# Patient Record
Sex: Female | Born: 1980 | Race: Asian | Hispanic: No | Marital: Married | Smoking: Never smoker
Health system: Southern US, Community
[De-identification: ages and names within clinical notes are randomized; demographics above are authoritative.]

## PROBLEM LIST (undated history)

## (undated) DIAGNOSIS — K219 Gastro-esophageal reflux disease without esophagitis: Secondary | ICD-10-CM

## (undated) DIAGNOSIS — E781 Pure hyperglyceridemia: Secondary | ICD-10-CM

## (undated) HISTORY — DX: Gastro-esophageal reflux disease without esophagitis: K21.9

## (undated) HISTORY — DX: Pure hyperglyceridemia: E78.1

---

## 2006-03-11 ENCOUNTER — Emergency Department (HOSPITAL_COMMUNITY): Admission: EM | Admit: 2006-03-11 | Discharge: 2006-03-11 | Payer: Self-pay | Admitting: Emergency Medicine

## 2009-08-02 ENCOUNTER — Encounter: Payer: Self-pay | Admitting: Women's Health

## 2009-08-02 ENCOUNTER — Ambulatory Visit: Payer: Self-pay | Admitting: Women's Health

## 2009-08-02 ENCOUNTER — Other Ambulatory Visit: Admission: RE | Admit: 2009-08-02 | Discharge: 2009-08-02 | Payer: Self-pay | Admitting: Gynecology

## 2013-03-13 ENCOUNTER — Ambulatory Visit: Payer: Self-pay | Admitting: Obstetrics & Gynecology

## 2014-12-16 ENCOUNTER — Other Ambulatory Visit (HOSPITAL_COMMUNITY)
Admission: RE | Admit: 2014-12-16 | Discharge: 2014-12-16 | Disposition: A | Discharge status: Home / Self Care | Payer: 59 | Source: Ambulatory Visit | Attending: Women's Health | Admitting: Women's Health

## 2014-12-16 ENCOUNTER — Ambulatory Visit (INDEPENDENT_AMBULATORY_CARE_PROVIDER_SITE_OTHER): Payer: 59 | Admitting: Women's Health

## 2014-12-16 ENCOUNTER — Encounter: Payer: Self-pay | Admitting: Women's Health

## 2014-12-16 VITALS — BP 118/80 | Ht 63.0 in | Wt 141.0 lb

## 2014-12-16 DIAGNOSIS — Z1151 Encounter for screening for human papillomavirus (HPV): Secondary | ICD-10-CM | POA: Insufficient documentation

## 2014-12-16 DIAGNOSIS — Z01419 Encounter for gynecological examination (general) (routine) without abnormal findings: Secondary | ICD-10-CM | POA: Diagnosis present

## 2014-12-16 NOTE — Patient Instructions (Signed)
Dolor De Espalda Crnico (Chronic Back Pain)  Cuando el dolor en la espalda dura ms de 3 meses, se denomina dolor de espalda crnico. Las personas que sufren dolor de espalda crnico generalmente pasan por perodos en los que es ms intenso (brotes).  CAUSAS  El dolor de espalda crnico puede estar originado en el desgaste degeneracin) de las diferentes estructuras de la espalda. Estas estructuras incluyen:  Los huesos de la columna vertebral (vrtebras) y las articulaciones rodean la mdula espinal y las races nerviosas (facetas).  Hay un tejido fibroso y fuerte que conecta las vrtebras (ligamentos). La degeneracin de estas estructuras provoca presin [employer]. Esto puede causar Manufacturing systems engineer.  INSTRUCCIONES PARA EL CUIDADO EN EL HOGAR   Evite encorvarse, levantar mucho peso, permanecer sentado por [employer], y las actividades que puedan empeorar el problema.  Tome breves perodos de descanso a travs del da para reducir Conservation officer, historic buildings durante los brotes. Recostarse o Public affairs consultant de pie generalmente es mejor que permanecer sentado para Production assistant, radio.  Tome slo medicamentos de venta libre o recetados, segn las indicaciones del mdico. SOLICITE ATENCIN MDICA DE INMEDIATO SI:  Siente debilidad intensa o adormecimiento en una de sus piernas o pies.  Tiene dificultad para controlar la vejiga o el intestino.  Presenta nuseas, vmitos, dolor abdominal, falta de aire o desmayos. Document Released: 11/27/2005 Document Revised: 02/19/2012 [facility] Patient Information 2015 [place]. This information is not intended to replace advice given to you by your health care provider. Make sure you discuss any questions you have with your health care provider. [place] (Health Maintenance) Adoptar un estilo de vida saludable y recibir atencin preventiva pueden ser de suma utilidad para promover la salud y Musician. Hable con el mdico para saber cul es el  esquema de exmenes peridicos adecuado para usted. Esta es una buena oportunidad para Teacher, adult education peridicamente al mdico sobre cmo prevenir enfermedades y [place] sano. Entre cada control mdico, hay muchas cosas que puede hacer por s solo. Los expertos han investigado mucho acerca de los cambios en el estilo de vida y las medidas preventivas que muy probablemente preserven su salud. Consulte al mdico para obtener ms informacin. EL PESO Y LA DIETA  Consuma una dieta saludable.  Incluya abundante cantidad de verduras, frutas, productos lcteos descremados y protenas magras.  No coma muchos alimentos con alto contenido de grasas slidas, azcares agregados o sal.  Realice actividad fsica con regularidad. Esta es una de las cosas ms importantes que puede hacer por su salud.  La [employer] de las personas adultas deben hacer actividad fsica durante por lo menos 182minutos semanales. El ejercicio debe aumentar la frecuencia cardaca y Nature conservation officer sudar (ejercicio de intensidad moderada).  Adems, casi todos los adultos deben hacer ejercicios de fortalecimiento al [employer] veces por semana como complemento del ejercicio de [place]. Mantenga un peso saludable.  El ndice de masa corporal [facility]) es una medida que puede usarse para identificar posibles problemas relacionados con el peso. Este ofrece un clculo estimativo de la Air traffic controller en funcin del peso y Agricultural consultant. El mdico puede determinar su [facility] y ayudarlo a Science writer y Theatre manager un peso saludable.  Para las mujeres mayores de 20aos:  Un [facility] menor de 18,5 se considera bajo peso.  Un [facility] entre 18,5 y 24,9 es normal.  Un [facility] entre 25 y 29,9 es sobrepeso.  Un IMC de 30 o ms se considera obesidad. Controlar los niveles de colesterol y lpidos en la sangre  Debe comenzar a Education administrator de sangre para controlar los [place] de lpidos y Oncologist a Glass blower/designer de [place], y repetir estos estudios cada 5aos.  Tal  vez deba someterse a controles de los niveles de colesterol con ms frecuencia si:  Tiene los niveles de lpidos o colesterol elevados.  Es mayor de 50aos.  Tiene un riesgo alto de tener enfermedades cardacas. DETECCIN DE CNCER  Cncer de pulmn  Se recomienda realizar exmenes de deteccin de cncer de pulmn a las personas adultas que tienen entre 55 y 80aos, y corren riesgo de Warehouse manager cncer de pulmn debido a sus antecedentes de tabaquismo.  Se recomienda realizar una tomografa computarizada anual de baja dosis de los pulmones a las personas que:  Siguen fumando.  Hayan dejado de fumar en los ltimos 15aos.  Hayan fumado un paquete diario durante 30aos. El ndice ao-paquete equivale a fumar, en promedio, un paquete de cigarrillos diario durante 1ao.  Debe seguir realizndose estudios de deteccin anual hasta que hayan pasado 15aos desde que dej de fumar.  Estos estudios deben suspenderse si tiene un problema de salud que le impedira recibir tratamiento para Management consultant de pulmn. Cncer de mama  Ponga en prctica la "autoconciencia de las mamas". Esto significa reconocer la apariencia normal de las mamas y cmo las siente.  Adems implica realizarse autoexmenes peridicos de [place]. Informe al mdico si hay algn cambio, sin importar cun pequeo sea.  Si tiene entre 20 y 30aos, un mdico debe hacerle un examen clnico de las mamas cada 1 a 3aos como parte del examen habitual de salud.  Si es mayor de 40aos, debe Geographical information systems officer un examen clnico de las [employer]. Tambin debe considerar la posibilidad de realizarse una radiografa de las mamas ([place]) todos los [place].  Si tiene antecedentes familiares de cncer de mama, hable con el mdico para saber si debe someterse a un estudio gentico.  Si tiene un riesgo alto de Hotel manager de mama, hable con el mdico para saber si debe hacerse una resonancia magntica y [employer].  Se recomienda una evaluacin del gen del cncer de mama (BRCA) a las mujeres que tengan familiares con tumores malignos relacionados con el BRCA. Los tumores malignos relacionados con elBRCA incluyen:  [employer].  Los [employer].  Los tumores malignos del peritoneo.  Los resultados de la evaluacin determinarn la necesidad de asesoramiento gentico y de [place] de BRCA1 y BRCA2. Cncer de cuello uterino Ya no se recomiendan los exmenes plvicos de rutina para la deteccin del cncer de cuello uterino en las mujeres que no estn embarazadas que son consideradas sujetos de bajo riesgo de Warehouse manager cncer de los rganos de la pelvis (ovarios, tero y vagina) y que no tienen sntomas. Tal vez sea necesario realizar un examen plvico si tiene sntomas, incluidos aquellos que estn asociados con infecciones en la pelvis. Pregntele al mdico si un examen plvico de deteccin es adecuado para usted.   El Papanicolau es la prueba de deteccin del cncer de cuello uterino para las mujeres que podran Scientist, research (life sciences).  Si le han realizado una histerectoma por un problema que no era cncer u otra enfermedad que podra causar cncer, ya no necesitar realizarse pruebas de Papanicolaou.  Si es mayor de 65aos y los resultados de las pruebas de Papanicolaou han sido normales durante los ltimos 10aos, ya no es necesario que se realice estos estudios.  Si ha recibido Pharmacist, community para el  cncer de cuello uterino o para una enfermedad que podra causar cncer, necesitar realizarse una prueba de Papanicolaou y controles durante al menos 64 aos de concluido el [place].  Si ya no se realiza pruebas de Papanicolaou, debe evaluar sus factores de riesgo si estos se modifican (por ejemplo, tiene un nuevo compaero sexual). Esta situacin puede influir en la necesidad de que se someta nuevamente a estudios de deteccin.  Algunas mujeres sufren problemas mdicos que aumentan la  probabilidad de tener cncer de cuello uterino. En este caso, el mdico podr indicarle que se someta a exmenes de deteccin y pruebas de Papanicolaou con ms frecuencia.  La prueba del virus del Engineer, technical sales (VPH) es un estudio adicional que puede usarse para la deteccin del cncer de cuello uterino. Esta prueba busca la presencia del virus que puede causar cambios celulares en el cuello del tero. Las clulas que se recolectan durante la prueba de Papanicolaou pueden usarse para el VPH.  La prueba del VPH puede usarse para examinar a las [employer] de 09FGH. Someterse a International aid/development worker del VPH puede prolongar el [employer] las pruebas de Papanicolaou normales de tres a Product manager.  Adems, se debe realizar la prueba del VPH para evaluar a las mujeres de cualquier edad cuyos resultados del Papanicolau no sean claros.  Despus de los 30aos, las mujeres deben realizarse pruebas del VPH con la misma frecuencia que las pruebas de Papanicolau. Cncer colorrectal  Es posible detectar este tipo de cncer y, a menudo, es posible prevenirlo.  Generalmente, los estudios de deteccin de rutina del cncer colorrectal empiezan a hacerse a Proofreader de los 39aos y [employer] [phone].  El mdico puede recomendar que se los haga antes, si tiene factores de riesgo de cncer de colon.  Adems, el mdico puede recomendar que use un kit de prueba casera para hallar sangre oculta en la materia fecal.  Es posible que se use una pequea cmara en el extremo de un tubo para examinar directamente el colon (sigmoidoscopa o colonoscopa), con el fin de Hydrographic surveyor las formas ms incipientes de Surveyor, minerals.  Generalmente, los estudios de deteccin de rutina se Insurance underwriter a Proofreader de [place].  El examen directo del colon debe repetirse cada 5 a 10aos hasta cumplir 75aos. Sin embargo, tal vez deba someterse a la prueba de deteccin con ms frecuencia si se encuentran formas  incipientes de plipos precancerosos o pequeos tumores. Cncer de piel  Revsese la piel peridicamente desde los dedos de los pies hasta la cabeza.  Informe al mdico si aparecen nuevos lunares o si nota cambios en los que ya tiene, especialmente en la forma o el color.  Tambin notifquele si tiene un lunar cuyo tamao es ms grande que el de la goma de un lpiz.  Use siempre pantalla solar. Aplique pantalla solar tantas veces como pueda a lo largo del da.  Protjase usando mangas y pantalones largos, un sombrero de ala ancha y gafas para el sol todo [place], siempre que est al [place]. ENFERMEDADES CARDACAS, DIABETES E HIPERTENSIN ARTERIAL   Debe controlarse la presin arterial al menos cada 1 o 2aos. La hipertensin arterial causa enfermedades cardacas y [place] el riesgo de ictus.  Si tiene entre 31 y 79aos, consulte al mdico si debe tomar aspirina para prevenir ictus.  Hgase anlisis peridicos para la diabetes, que incluyen la toma de [place] de sangre para Freight forwarder nivel de azcar en la sangre mientras est en ayunas.  Si su peso es normal y tiene un riesgo bajo de tener diabetes, hgase este anlisis una vez cada tres aos, despus de los 45aos.  Si tiene sobrepeso y un riesgo alto de sufrir diabetes, considere la posibilidad de [employer] a una edad ms temprana o con ms frecuencia. PREVENCIN DE INFECCIONES  Hepatitis B  Si tiene un riesgo ms alto de tener hepatitisB, debe hacerse anlisis de deteccin de [place] virus. Se considera que tiene un alto riesgo de hepatitis B si:  Naci en un pas donde la hepatitisB es frecuente. Pregntele al mdico qu pases son considerados de Public affairs consultant.  Sus padres nacieron en un pas de alto riesgo, y usted no recibi la vacuna contra la hepatitis B.  [place].  [place] agujas para inyectarse drogas.  Convive con una persona que tiene hepatitisB.  Tuvo relaciones sexuales con una persona  que tiene hepatitisB.  Recibe tratamiento de hemodilisis.  Toma ciertos medicamentos para cuadros clnicos tales como cncer, trasplante de

## 2014-12-16 NOTE — Progress Notes (Signed)
Peggy Barker 08-31-1981 [id number]    History:    Presents for annual exam.  1 month ago had burning with urination, lasted for two days and resolved with berry juice. Regular monthly cycles/withdrawal. Peggy ReaUnsure if desires children. Declines other forms of contraception due to side effect profile. Same partner. Chronic low back pain and headaches persisting for three years, alleviated by ibuprofen, has seen primary care for both.   Past medical history, past surgical history, family history and social history were all reviewed and documented in the EPIC chart. Married for 11 years. Has an online organic business. Parents and siblings healthy. Originally [place]  ROS:  A ROS was performed and pertinent positives and negatives are included.  Exam:  Filed Vitals:   12/16/14 1419  BP: 118/80    General appearance:  Normal Thyroid:  Symmetrical, normal in size, without palpable masses or nodularity. Respiratory  Auscultation:  Clear without wheezing or rhonchi Cardiovascular  Auscultation:  Regular rate, without rubs, murmurs or gallops  Edema/varicosities:  Not grossly evident Abdominal  Soft,nontender, without masses, guarding or rebound.  Liver/spleen:  No organomegaly noted  Hernia:  None appreciated  Skin  Inspection:  Grossly normal   Breasts: Examined lying and sitting.     Right: Without masses, retractions, discharge or axillary adenopathy.     Left: Without masses, retractions, discharge or axillary adenopathy. Gentitourinary   Inguinal/mons:  Normal without inguinal adenopathy  External genitalia:  Normal  BUS/Urethra/Skene's glands:  Normal  Vagina:  Normal  Cervix:  Normal  Uterus:  Normal in size, shape and contour.  Midline and mobile  Adnexa/parametria:     Rt: Without masses or tenderness.   Lt: Without masses or tenderness.  Anus and perineum: Normal  Digital rectal exam: Normal sphincter tone without palpated masses or  tenderness  Assessment/Plan:  34 y.o.  MHF G0 for annual exam.     Contraception management  Normal GYN exam.   Plan: MVI encouraged and educated about benefits. Reviewed new pap guidelines. Healthy diet, exercise, SBE's, contraception options reviewed, declines will continue with withdrawal.  UA, Pap with HR HPV typing. New screening guidelines reviewed.    Harrington ChallengerYOUNG,Peggy Barker J WHNP, 3:01 PM 12/16/2014

## 2014-12-17 LAB — URINALYSIS W MICROSCOPIC + REFLEX CULTURE
Bacteria, UA: NONE SEEN
Bilirubin Urine: NEGATIVE
Casts: NONE SEEN
Crystals: NONE SEEN
GLUCOSE, UA: NEGATIVE mg/dL
Hgb urine dipstick: NEGATIVE
Ketones, ur: NEGATIVE mg/dL
LEUKOCYTES UA: NEGATIVE
Nitrite: NEGATIVE
PH: 7 (ref 5.0–8.0)
PROTEIN: NEGATIVE mg/dL
Specific Gravity, Urine: 1.008 (ref 1.005–1.030)
UROBILINOGEN UA: 0.2 mg/dL (ref 0.0–1.0)

## 2014-12-18 LAB — CYTOLOGY - PAP

## 2016-01-31 ENCOUNTER — Ambulatory Visit: Payer: 59 | Admitting: Gastroenterology

## 2017-04-25 ENCOUNTER — Encounter: Payer: Self-pay | Admitting: Gynecology

## 2018-04-24 ENCOUNTER — Ambulatory Visit (INDEPENDENT_AMBULATORY_CARE_PROVIDER_SITE_OTHER): Payer: 59 | Admitting: Physician Assistant

## 2018-04-24 ENCOUNTER — Other Ambulatory Visit: Payer: Self-pay

## 2018-04-24 ENCOUNTER — Encounter: Payer: Self-pay | Admitting: Physician Assistant

## 2018-04-24 VITALS — BP 98/60 | HR 93 | Temp 98.9°F | Resp 16 | Ht 63.75 in | Wt 141.2 lb

## 2018-04-24 DIAGNOSIS — R0982 Postnasal drip: Secondary | ICD-10-CM

## 2018-04-24 DIAGNOSIS — J029 Acute pharyngitis, unspecified: Secondary | ICD-10-CM | POA: Diagnosis not present

## 2018-04-24 LAB — POCT RAPID STREP A (OFFICE): Rapid Strep A Screen: NEGATIVE

## 2018-04-24 MED ORDER — FLUTICASONE PROPIONATE 50 MCG/ACT NA SUSP: 2.0000 | Freq: Every day | NASAL | 6 refills | Status: DC

## 2018-04-24 NOTE — Patient Instructions (Addendum)
Start using flonase twice daily for the next 1-2 months. Continue taking zyrtec twice daily. If Zyrtec-D is no longer helping you, try a different allergy medication (Claritin-D, Xyzal).   Stay well hydrated. Drink enough water and fluids to keep your urine clear or pale yellow.  Advil or ibuprofen for pain.   For sore throat try using a honey-based tea. Use 3 teaspoons of honey with juice squeezed from half lemon. Place shaved pieces of ginger into 1/2-1 cup of water and warm over stove top. Then mix the ingredients and repeat every 4 hours as needed.  For sore throat: ? Gargle with 8 oz of salt water ( tsp of salt per 1 qt of water) as often as every 1-2 hours to soothe your throat.  Gargle liquid benadryl.  Cepacol throat lozenges.  ?Use a humidifier in your bedroom at night while you sleep.  ?If you have allergies, avoid the things you are allergic to (like pollen, dust, animals, or mold)  Come back and see me if you are not better in 2 weeks.   We will contact you with the results of today's lab work.    Thank you for coming in today. I hope you feel we met your needs.  Feel free to call PCP if you have any questions or further requests.  Please consider signing up for MyChart if you do not already have it, as this is a great way to communicate with me.  Best,  [employer], PA-C

## 2018-04-24 NOTE — Progress Notes (Signed)
   Peggy Barker  MRN: [id number] DOB: 1981/09/14  PCP: Patient, No Pcp Per  Subjective:  Pt is a 37 year old female who presents to clinic for sore throat x 2 month. Endorses feeling "swollen" on the right side, itching and burning. R>L. Hurts worse at night.  She has been taking allergy pill - not working. She has tried cold medicine - not working.  Her throat has felt "weird" since she had endoscopy last year.   She does not smoke   Review of Systems  Constitutional: Negative for chills, diaphoresis, fatigue and fever.  HENT: Positive for sore throat. Negative for congestion, postnasal drip, rhinorrhea, sinus pressure, sinus pain, trouble swallowing and voice change.   Respiratory: Negative for cough, shortness of breath and wheezing.   Gastrointestinal: Negative for abdominal pain and nausea.  Skin: Negative.     There are no active problems to display for this patient.   Current Outpatient Medications on File Prior to Visit  Medication Sig Dispense Refill  . cetirizine-pseudoephedrine (ZYRTEC-D) 5-120 MG tablet Take 1 tablet by mouth 2 (two) times daily.    Marland Kitchen ibuprofen (ADVIL,MOTRIN) 200 MG tablet Take 200 mg by mouth every 6 (six) hours as needed.     No current facility-administered medications on file prior to visit.     No Known Allergies   Objective:  BP 98/60 (BP Location: Left Arm, Patient Position: Sitting, Cuff Size: Normal)   Pulse 93   Temp 98.9 F (37.2 C) (Oral)   Resp 16   Ht 5' 3.75" (1.619 m)   Wt 141 lb 3.2 oz (64 kg)   LMP 04/14/2018   SpO2 100%   BMI 24.43 kg/m   Physical Exam  Constitutional: She is oriented to person, place, and time. No distress.  HENT:  Right Ear: Tympanic membrane normal.  Left Ear: Tympanic membrane normal.  Nose: Mucosal edema present. No rhinorrhea. Right sinus exhibits no maxillary sinus tenderness and no frontal sinus tenderness. Left sinus exhibits no maxillary sinus tenderness and no frontal sinus  tenderness.  Mouth/Throat: Uvula is midline, oropharynx is clear and moist and mucous membranes are normal.  Cobblestoning of oropharynx.   Neurological: She is alert and oriented to person, place, and time.  Skin: Skin is warm and dry.  Psychiatric: Judgment normal.  Vitals reviewed.   Results for orders placed or performed in visit on 04/24/18  POCT rapid strep A  Result Value Ref Range   Rapid Strep A Screen Negative Negative    Assessment and Plan :  1. Post-nasal drip - fluticasone (FLONASE) 50 MCG/ACT nasal spray; Place 2 sprays into both nostrils daily.  Dispense: 16 g; Refill: 6 - Pt presents c/o sore throat x 2 months. PE suggests PND. Plan to treat for allergic rhinitis with flonase bid. Labs are pending, will contact with results.  RTC in 2 weeks if no improvement.  2. Sore throat - POCT rapid strep A - Culture, Group A Strep - CMP14+EGFR - Epstein-Barr virus VCA antibody panel  Mercer Pod, PA-C  [facility] [place] 04/24/2018 2:07 PM

## 2018-04-25 LAB — CMP14+EGFR
ALT: 21 IU/L (ref 0–32)
AST: 23 IU/L (ref 0–40)
Albumin/Globulin Ratio: 1.7 (ref 1.2–2.2)
Albumin: 4.3 g/dL (ref 3.5–5.5)
Alkaline Phosphatase: 60 IU/L (ref 39–117)
BUN/Creatinine Ratio: 10 (ref 9–23)
BUN: 7 mg/dL (ref 6–20)
Bilirubin Total: 0.4 mg/dL (ref 0.0–1.2)
CO2: 21 mmol/L (ref 20–29)
Calcium: 9.7 mg/dL (ref 8.7–10.2)
Chloride: 104 mmol/L (ref 96–106)
Creatinine, Ser: 0.71 mg/dL (ref 0.57–1.00)
GFR calc Af Amer: 129 mL/min/{1.73_m2} (ref >59)
GFR calc non Af Amer: 111 mL/min/{1.73_m2} (ref >59)
Globulin, Total: 2.5 g/dL (ref 1.5–4.5)
Glucose: 88 mg/dL (ref 65–99)
Potassium: 4.1 mmol/L (ref 3.5–5.2)
Sodium: 140 mmol/L (ref 134–144)
Total Protein: 6.8 g/dL (ref 6.0–8.5)

## 2018-04-25 LAB — EPSTEIN-BARR VIRUS VCA ANTIBODY PANEL
EBV Early Antigen Ab, IgG: <9 U/mL (ref 0.0–8.9)
EBV NA IgG: 228 U/mL — ABNORMAL HIGH (ref 0.0–17.9)
EBV VCA IgG: 37.4 U/mL — ABNORMAL HIGH (ref 0.0–17.9)
EBV VCA IgM: <36 U/mL (ref 0.0–35.9)

## 2018-04-27 LAB — CULTURE, GROUP A STREP: Strep A Culture: NEGATIVE

## 2018-05-07 ENCOUNTER — Encounter: Payer: Self-pay | Admitting: Physician Assistant

## 2018-09-17 ENCOUNTER — Encounter: Payer: Self-pay | Admitting: Family Medicine

## 2018-09-17 ENCOUNTER — Other Ambulatory Visit: Payer: Self-pay

## 2018-09-17 ENCOUNTER — Ambulatory Visit: Payer: 59 | Admitting: Family Medicine

## 2018-09-17 VITALS — BP 94/62 | HR 76 | Temp 98.4°F | Resp 17 | Ht 63.75 in | Wt 153.6 lb

## 2018-09-17 DIAGNOSIS — R05 Cough: Secondary | ICD-10-CM

## 2018-09-17 DIAGNOSIS — M25511 Pain in right shoulder: Secondary | ICD-10-CM

## 2018-09-17 DIAGNOSIS — K2270 Barrett's esophagus without dysplasia: Secondary | ICD-10-CM | POA: Diagnosis not present

## 2018-09-17 DIAGNOSIS — R0982 Postnasal drip: Secondary | ICD-10-CM

## 2018-09-17 DIAGNOSIS — R059 Cough, unspecified: Secondary | ICD-10-CM

## 2018-09-17 DIAGNOSIS — J301 Allergic rhinitis due to pollen: Secondary | ICD-10-CM | POA: Diagnosis not present

## 2018-09-17 DIAGNOSIS — G8929 Other chronic pain: Secondary | ICD-10-CM

## 2018-09-17 MED ORDER — FLUTICASONE PROPIONATE 50 MCG/ACT NA SUSP: 2.0000 | Freq: Every day | NASAL | 6 refills | Status: DC

## 2018-09-17 MED ORDER — OMEPRAZOLE 20 MG PO CPDR: 20.0000 mg | DELAYED_RELEASE_CAPSULE | Freq: Every day | ORAL | 3 refills | Status: DC

## 2018-09-17 NOTE — Patient Instructions (Addendum)
Aspercreme with lidocaine  Apply to the right shoulder as needed for pain    If you have lab work done today you will be contacted with your lab results within the next 2 weeks.  If you have not heard from [place] then please contact us. The fastest way to get your results is to register for My Chart.   IF you received an x-ray today, you will receive an invoice from [facility]. Please contact [facility] at [phone] with questions or concerns regarding your invoice.   IF you received labwork today, you will receive an invoice from [place]. Please contact [facility] at [phone] with questions or concerns regarding your invoice.   Our billing staff will not be able to assist you with questions regarding bills from these companies.  You will be contacted with the lab results as soon as they are available. The fastest way to get your results is to activate your My Chart account. Instructions are located on the last page of this paperwork. If you have not heard from [place] regarding the results in 2 weeks, please contact this office.     Barrett Esophagus Barrett esophagus occurs when the tissue that lines the esophagus changes or becomes damaged. The esophagus is the tube that carries food from the throat to the stomach. With Barrett esophagus, the cells that line the esophagus are replaced by cells that are similar to the lining of the intestines (intestinal metaplasia). Barrett esophagus itself may not cause any symptoms. However, many people who have Barrett esophagus also have gastroesophageal reflux disease (GERD), which may cause symptoms such as heartburn. Treatment may include medicines, procedures to destroy the abnormal cells, or surgery. Over time, a few people with this condition may develop cancer of the esophagus. What are the causes? The exact cause of this condition is not known. In some cases, the condition develops from damage to the lining of the esophagus  caused by GERD. GERD occurs when stomach acids flow up from the stomach into the esophagus. Frequent symptoms of GERD may cause intestinal metaplasia or cause cell changes (dysplasia). What increases the risk? The following factors may make you more likely to develop this condition:  Having GERD.  Being any of the following: ? Female. ? White (Caucasian). ? Obese. ? Older than 50.  Having a hiatal hernia.  Smoking.  What are the signs or symptoms? People with Barrett esophagus often have no symptoms. However, many people with this condition also have GERD. Symptoms of GERD may include:  Heartburn.  Difficulty swallowing.  Dry cough.  How is this diagnosed? Barrett esophagus may be diagnosed with an exam called an upper gastrointestinal endoscopy. During this exam, a thin, flexible tube (endoscope) is passed down your esophagus. The endoscope has a light and camera on the end of it. Your health care provider uses the endoscope to view the inside of your esophagus. During the exam, several tissue samples will be removed (biopsy) from your esophagus so they can be checked for intestinal metaplasia or dysplasia. How is this treated? Treatment for this condition may include:  Medicines (proton pump inhibitors, or PPIs) to decrease or stop GERD.  Periodic endoscopic exams to make sure that cancer is not developing.  A procedure or surgery for dysplasia. This may include: ? Endoscopic removal or destruction of abnormal cells. ? Removal of part of the esophagus (esophagectomy).  Follow these instructions at home: Eating and drinking  Eat more fruits and vegetables.  Avoid fatty foods.  Eat small, frequent meals instead of large meals.  Avoid foods that cause heartburn. These foods include: ? Coffee and alcoholic drinks. ? Tomatoes and foods made with tomatoes. ? Greasy or spicy foods. ? Chocolate and peppermint. General instructions   Take over-the-counter and  prescription medicines only as told by your health care provider.  Do not use any tobacco products, such as cigarettes, chewing tobacco, and e-cigarettes. If you need help quitting, ask your health care provider.  If your health care provider is treating you for GERD, make sure you follow all instructions and take medicines as directed.  Keep all follow-up visits as told by your health care provider. This is important. Contact a health care provider if:  You have heartburn or GERD symptoms.  You have difficulty swallowing. Get help right away if:  You have chest pain.  You are unable to swallow.  You vomit blood or material that looks like coffee grounds.  Your stool (feces) is bright red or dark. This information is not intended to replace advice given to you by your health care provider. Make sure you discuss any questions you have with your health care provider. Document Released: 02/17/2004 Document Revised: 05/04/2016 Document Reviewed: 09/09/2015 Elsevier Interactive Patient Education  [employer].

## 2018-09-17 NOTE — Progress Notes (Signed)
Chief Complaint  Patient presents with  . cold sxs/cough    seen 2 mos ago for cold sxs but they never went away, takes nyquil sometimes  . right shoulder blade pain    x 1 yr, taking ibuprofen occasionally for the pain, pain 7/10, cold compress and massages help sometimes but pain returns    HPI  Cough  Barrett's Esophagus Patient reports that she has been diagnosed with Barrett's Esophagus at [facility]  She does not take any PPI or reflux medications She was diagnosed a year ago She states that she has been coughing for 2-3 months It feels like she has something in her esophagus  Allergic rhinitis She states that she does zyrtec twice weekly She does not use the nasal spray because she lost it She does not have any green or purulent drainage  Right Shoulder Pain She reports that she gets right pain under the shoulder  She used ice packs and compresses and it comes and goes  She takes ibuprofen  Sometimes she gets it and it stays for a month She states that she took some ibuprofen  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 GERD (gastroesophageal reflux disease)     Current Outpatient Medications  Medication Sig Dispense Refill  . ibuprofen (ADVIL,MOTRIN) 200 MG tablet Take 200 mg by mouth every 6 (six) hours as needed.    . cetirizine-pseudoephedrine (ZYRTEC-D) 5-120 MG tablet Take 1 tablet by mouth 2 (two) times daily.    . fluticasone (FLONASE) 50 MCG/ACT nasal spray Place 2 sprays into both nostrils daily. 16 g 6  . omeprazole (PRILOSEC) 20 MG capsule Take 1 capsule (20 mg total) by mouth daily. 30 capsule 3   No current facility-administered medications for this visit.     Allergies: No Known Allergies  History reviewed. No pertinent surgical history.  Social History   Socioeconomic History  . Marital status: Married    Spouse name:  Not on file  . Number of children: Not on file  . Years of education: Not on file  . Highest education level: Not on file  Occupational History  . Not on file  Social Needs  . Financial resource strain: Not on file  . Food insecurity:    Worry: Not on file    Inability: Not on file  . Transportation needs:    Medical: Not on file    Non-medical: Not on file  Tobacco Use  . Smoking status: Never Smoker  . Smokeless tobacco: Never Used  Substance and Sexual Activity  . Alcohol use: No    Alcohol/week: 0.0 standard drinks  . Drug use: No  . Sexual activity: Yes    Comment: INTERCOURSE 20'S SEXUAL PARTNER 1  Lifestyle  . Physical activity:    Days per week: Not on file    Minutes per session: Not on file  . Stress: Not on file  Relationships  . Social connections:    Talks on phone: Not on file    Gets together: Not on file    Attends religious service: Not on file    Active member of club or organization: Not on file    Attends meetings of clubs or organizations: Not on file    Relationship status: Not on file  Other Topics Concern  . Not on file  Social History Narrative  .  Not on file    History reviewed. No pertinent family history.   ROS Review of Systems See HPI Constitution: No fevers or chills No malaise No diaphoresis Skin: No rash or itching Eyes: no blurry vision, no double vision GU: no dysuria or hematuria Neuro: no dizziness or headaches  all others reviewed and negative   Objective: Vitals:   09/17/18 0933  BP: 94/62  Pulse: 76  Resp: 17  Temp: 98.4 F (36.9 C)  TempSrc: Oral  SpO2: 100%  Weight: 153 lb 9.6 oz (69.7 kg)  Height: 5' 3.75" (1.619 m)    Physical Exam  Constitutional: She is oriented to person, place, and time. She appears well-developed and well-nourished.  HENT:  Head: Normocephalic and atraumatic.  Right Ear: External ear normal.  Left Ear: External ear normal.  Nose: Nose normal.  Mouth/Throat: Oropharynx is  clear and moist.  Eyes: Conjunctivae and EOM are normal.  Neck: Normal range of motion. Neck supple.  Cardiovascular: Normal rate, regular rhythm and normal heart sounds.  No murmur heard. Pulmonary/Chest: Effort normal and breath sounds normal. No stridor. No respiratory distress.  Abdominal: Soft. Bowel sounds are normal. She exhibits no distension and no mass. There is no tenderness. There is no guarding.  Musculoskeletal:       Right shoulder: She exhibits tenderness. She exhibits normal range of motion, no bony tenderness, no swelling, no effusion, no crepitus, no deformity, no laceration, no pain, no spasm, normal pulse and normal strength.       Left shoulder: Normal. She exhibits normal range of motion, no tenderness and no bony tenderness.       Arms: Neurological: She is alert and oriented to person, place, and time.  Skin: Skin is warm. Capillary refill takes less than 2 seconds.  Psychiatric: She has a normal mood and affect. Her behavior is normal. Judgment and thought content normal.    Assessment and Plan Arial was seen today for cold sxs/cough and right shoulder blade pain.  Diagnoses and all orders for this visit:  Barrett's esophagus without dysplasia- pt with ? Of barrett's  Will get records from [place] to verigy Will give omeprazole as GERD can cause cough -     omeprazole (PRILOSEC) 20 MG capsule; Take 1 capsule (20 mg total) by mouth daily.  Chronic right shoulder pain- continue prn NSAID  Advised aspercreme with lidocaine topical Referral placed for PT -     Ambulatory referral to Physical Therapy  Seasonal allergic rhinitis due to pollen- resume zyrtec and flonase   Cough- could be due to rhinitis and GERD from ?Barrett's  Post-nasal drip -     fluticasone (FLONASE) 50 MCG/ACT nasal spray; Place 2 sprays into both nostrils daily.     Siyana Erney A Zyad Boomer

## 2018-09-18 ENCOUNTER — Ambulatory Visit: Payer: 59 | Admitting: Physician Assistant

## 2018-10-24 ENCOUNTER — Ambulatory Visit: Payer: 59 | Attending: Family Medicine

## 2018-11-25 DIAGNOSIS — M9901 Segmental and somatic dysfunction of cervical region: Secondary | ICD-10-CM | POA: Diagnosis not present

## 2018-11-25 DIAGNOSIS — M5441 Lumbago with sciatica, right side: Secondary | ICD-10-CM | POA: Diagnosis not present

## 2018-11-25 DIAGNOSIS — R293 Abnormal posture: Secondary | ICD-10-CM | POA: Diagnosis not present

## 2018-11-27 DIAGNOSIS — R293 Abnormal posture: Secondary | ICD-10-CM | POA: Diagnosis not present

## 2018-11-27 DIAGNOSIS — M9901 Segmental and somatic dysfunction of cervical region: Secondary | ICD-10-CM | POA: Diagnosis not present

## 2018-11-27 DIAGNOSIS — M5441 Lumbago with sciatica, right side: Secondary | ICD-10-CM | POA: Diagnosis not present

## 2019-12-26 ENCOUNTER — Ambulatory Visit: Payer: 59 | Admitting: Family Medicine

## 2019-12-26 ENCOUNTER — Encounter: Payer: Self-pay | Admitting: Family Medicine

## 2019-12-26 ENCOUNTER — Other Ambulatory Visit: Payer: Self-pay

## 2019-12-26 VITALS — BP 102/74 | HR 90 | Temp 98.1°F | Ht 63.75 in | Wt 152.8 lb

## 2019-12-26 DIAGNOSIS — G8929 Other chronic pain: Secondary | ICD-10-CM

## 2019-12-26 DIAGNOSIS — M546 Pain in thoracic spine: Secondary | ICD-10-CM

## 2019-12-26 DIAGNOSIS — J029 Acute pharyngitis, unspecified: Secondary | ICD-10-CM

## 2019-12-26 LAB — POCT URINALYSIS DIP (MANUAL ENTRY)
Bilirubin, UA: NEGATIVE
Glucose, UA: NEGATIVE mg/dL
Ketones, POC UA: NEGATIVE mg/dL
Leukocytes, UA: NEGATIVE
Nitrite, UA: NEGATIVE
Protein Ur, POC: NEGATIVE mg/dL
Spec Grav, UA: 1.025 (ref 1.010–1.025)
Urobilinogen, UA: 0.2 E.U./dL
pH, UA: 5.5 (ref 5.0–8.0)

## 2019-12-26 LAB — POCT RAPID STREP A (OFFICE): Rapid Strep A Screen: NEGATIVE

## 2019-12-26 MED ORDER — MELOXICAM 15 MG PO TABS: 15.0000 mg | ORAL_TABLET | Freq: Every evening | ORAL | 1 refills | Status: DC | PRN

## 2019-12-26 MED ORDER — CYCLOBENZAPRINE HCL 10 MG PO TABS: 10.0000 mg | ORAL_TABLET | Freq: Three times a day (TID) | ORAL | 1 refills | Status: DC | PRN

## 2019-12-26 NOTE — Patient Instructions (Addendum)
     If you have lab work done today you will be contacted with your lab results within the next 2 weeks.  If you have not heard from us then please contact us. The fastest way to get your results is to register for My Chart.   IF you received an x-ray today, you will receive an invoice from [facility]. Please contact [facility] at [phone] with questions or concerns regarding your invoice.   IF you received labwork today, you will receive an invoice from [facility]. Please contact [facility] at [phone] with questions or concerns regarding your invoice.   Our billing staff will not be able to assist you with questions regarding bills from these companies.  You will be contacted with the lab results as soon as they are available. The fastest way to get your results is to activate your My Chart account. Instructions are located on the last page of this paperwork. If you have not heard from us regarding the results in 2 weeks, please contact this office.     Faringitis Pharyngitis  La faringitis es un dolor de garganta (faringe). Se produce cuando la garganta presenta enrojecimiento, dolor e hinchazn. La mayora de las veces, esta afeccin mejora por s sola. En algunos casos, podra requerir la administracin de medicamentos. Siga estas indicaciones en su casa:  Tome los medicamentos de venta libre y los recetados solamente como se lo haya indicado el mdico. ? Si le recetaron un antibitico, tmelo como se lo haya indicado el mdico. No deje de tomar los antibiticos aunque comience a sentirse mejor. ? No administre aspirina a los nios. La aspirina se ha vinculado al sndrome de Reye.  Beba suficiente agua y lquido para mantener el pis (orina) de color claro o amarillo plido.  Descanse lo suficiente.  Enjuguese la boca (haga grgaras) con una mezcla de agua con sal 3o 4veces al da o segn sea necesario. Para preparar la mezcla de agua con sal, disuelva  por completo de media a 1cucharadita de sal en 1taza de agua tibia.  Si su mdico lo aprueba, puede usar pastillas o aerosoles para aliviar el dolor de garganta. Comunquese con un mdico si:  Tiene bultos grandes y dolorosos en el cuello.  Tiene una erupcin cutnea.  Cuando tose elimina una expectoracin verde, amarillo amarronado o con sangre. Solicite ayuda de inmediato si:  Presenta rigidez en el cuello.  Babea o no puede tragar lquidos.  No puede beber o tomar medicamentos sin vomitar.  Siente un dolor intenso que no se alivia con medicamentos.  Tiene problemas para respirar que no se deben a la congestin nasal.  Tiene dolor e hinchazn en las rodillas, los tobillos, las muecas o los codos que antes no tena. Resumen  La faringitis es un dolor de garganta (faringe). Se produce cuando la garganta presenta enrojecimiento, dolor e hinchazn.  Si le recetaron un antibitico, tmelo como se lo haya indicado el mdico. No deje de tomar los antibiticos aunque comience a sentirse mejor.  La mayora de las veces, la faringitis mejora por s sola. A veces, puede requerir la administracin de medicamentos. Esta informacin no tiene como fin reemplazar el consejo del mdico. Asegrese de hacerle al mdico cualquier pregunta que tenga. Document Revised: 08/22/2017 Document Reviewed: 08/22/2017 Elsevier Patient Education  2020 Elsevier Inc.  

## 2019-12-26 NOTE — Progress Notes (Signed)
1/15/20213:30 PM  Peggy Barker [birth year], 39 y.o., female [id number]  Chief Complaint  Patient presents with  . Back Pain  . Adenopathy    pain in the neck area, says glands swells when season change    HPI:   Patient is a 39 y.o. female  who presents today with several concerns  Right sided enlarged LN x 1 week Causing ear pain and sore throat, having mild headache, no cough, no fever or chills  Having right sided upper back pain Radiates to anterior chest wall, down her right arm, associated with numbness in RUE, no weakness Takes ibuprofen as needed - helps She reports normal spine xrays done elsewhere   Depression screen [employer] 2/9 12/26/2019 09/17/2018 04/24/2018  Decreased Interest 0 0 0  Down, Depressed, Hopeless 0 0 0  PHQ - 2 Score 0 0 0    Fall Risk  12/26/2019 09/17/2018 04/24/2018  Falls in the past year? 0 No No  Number falls in past yr: 0 - -  Injury with Fall? 0 - -     No Known Allergies  Prior to Admission medications   Not on File    Past Medical History:  Diagnosis Date  . GERD (gastroesophageal reflux disease)     History reviewed. No pertinent surgical history.  Social History   Tobacco Use  . Smoking status: Never Smoker  . Smokeless tobacco: Never Used  Substance Use Topics  . Alcohol use: No    Alcohol/week: 0.0 standard drinks    History reviewed. No pertinent family history.  ROS Per hpi  OBJECTIVE:  Today's Vitals   12/26/19 1526  BP: 102/74  Pulse: 90  Temp: 98.1 F (36.7 C)  SpO2: 97%  Weight: 152 lb 12.8 oz (69.3 kg)  Height: 5' 3.75" (1.619 m)   Body mass index is 26.43 kg/m.   Physical Exam Vitals and nursing note reviewed.  Constitutional:      Appearance: She is well-developed.  HENT:     Head: Normocephalic and atraumatic.     Right Ear: Hearing, tympanic membrane, ear canal and external ear normal.     Left Ear: Hearing, tympanic membrane, ear canal and external ear normal.     Mouth/Throat:      Mouth: Mucous membranes are moist.     Pharynx: Posterior oropharyngeal erythema present. No oropharyngeal exudate.     Tonsils: Tonsillar exudate present. 2+ on the right. 2+ on the left.  Eyes:     Conjunctiva/sclera: Conjunctivae normal.     Pupils: Pupils are equal, round, and reactive to light.  Cardiovascular:     Rate and Rhythm: Normal rate and regular rhythm.     Pulses: Normal pulses.     Heart sounds: Normal heart sounds. No murmur. No friction rub. No gallop.   Pulmonary:     Effort: Pulmonary effort is normal.     Breath sounds: Normal breath sounds. No wheezing, rhonchi or rales.  Musculoskeletal:     Cervical back: Normal and neck supple.     Thoracic back: Spasms, tenderness and bony tenderness present.       Back:  Lymphadenopathy:     Cervical: Cervical adenopathy present.  Skin:    General: Skin is warm and dry.  Neurological:     Mental Status: She is alert and oriented to person, place, and time.     Motor: No weakness.     Deep Tendon Reflexes: Reflexes are normal and symmetric.     Results for  orders placed or performed in visit on 12/26/19 (from the past 24 hour(s))  POCT urine dipstick     Status: Abnormal   Collection Time: 12/26/19  3:33 PM  Result Value Ref Range   Color, UA yellow yellow   Clarity, UA clear clear   Glucose, UA negative negative mg/dL   Bilirubin, UA negative negative   Ketones, POC UA negative negative mg/dL   Spec Grav, UA 1.025 1.010 - 1.025   Blood, UA trace-intact (A) negative   pH, UA 5.5 5.0 - 8.0   Protein Ur, POC negative negative mg/dL   Urobilinogen, UA 0.2 0.2 or 1.0 E.U./dL   Nitrite, UA Negative Negative   Leukocytes, UA Negative Negative  POCT rapid strep A     Status: None   Collection Time: 12/26/19  4:03 PM  Result Value Ref Range   Rapid Strep A Screen Negative Negative    No results found.   ASSESSMENT and PLAN  1. Chronic right-sided thoracic back pain Discussed conservative measures.  Reviewed new meds r/se/b. Referring to PT. - POCT urine dipstick - Ambulatory referral to Physical Therapy  2. Acute pharyngitis, unspecified etiology Viral. Discussed supportive measures and RTC precautions.  - POCT rapid strep A  Other orders - meloxicam (MOBIC) 15 MG tablet; Take 1 tablet (15 mg total) by mouth at bedtime as needed for pain. - cyclobenzaprine (FLEXERIL) 10 MG tablet; Take 1 tablet (10 mg total) by mouth 3 (three) times daily as needed for muscle spasms.  Return if symptoms worsen or fail to improve.    Rutherford Guys, MD Primary Care at [place], [place] [address] Ph.  [phone] Fax [phone]

## 2019-12-29 LAB — CULTURE, GROUP A STREP: Strep A Culture: NEGATIVE

## 2020-01-29 ENCOUNTER — Other Ambulatory Visit: Payer: Self-pay

## 2020-01-29 ENCOUNTER — Encounter: Payer: 59 | Admitting: Family Medicine

## 2020-01-29 NOTE — Progress Notes (Signed)
Pt is following up with back pain. Says she was only able to take the cyclobenzaprine for one week due to the migraines that came along with taking that medication. She continues to take the mobic at bedtime, however it does not help with the back pain at all, just makes her sleepy yet wakes up groggy. She mentioned that the alternative discussed at ov was to get injection in the back. She is now considering that option because pain is not gong away.

## 2020-01-29 NOTE — Progress Notes (Signed)
Called patient multiple times wo answer  This encounter was created in error - please disregard.

## 2020-01-29 NOTE — Progress Notes (Deleted)
   Virtual Visit Note  I connected with patient on 01/29/20 at 1116am  by *** and verified that I am speaking with the correct person using two identifiers. Peggy Barker is currently located at *** and {family members:[id number]} is currently with them during visit. The provider, Myles Lipps, MD is located in their office at time of visit.  I discussed the limitations, risks, security and privacy concerns of performing an evaluation and management service by telephone and the availability of in person appointments. I also discussed with the patient that there may be a patient responsible charge related to this service. The patient expressed understanding and agreed to proceed.   CC: thoracic back pain  HPI ?   No Known Allergies  Prior to Admission medications   Medication Sig Start Date End Date Taking? Authorizing Provider  meloxicam (MOBIC) 15 MG tablet Take 1 tablet (15 mg total) by mouth at bedtime as needed for pain. 12/26/19  Yes Myles Lipps, MD  cyclobenzaprine (FLEXERIL) 10 MG tablet Take 1 tablet (10 mg total) by mouth 3 (three) times daily as needed for muscle spasms. Patient not taking: Reported on 01/29/2020 12/26/19   Myles Lipps, MD    Past Medical History:  Diagnosis Date  . GERD (gastroesophageal reflux disease)     No past surgical history on file.  Social History   Tobacco Use  . Smoking status: Never Smoker  . Smokeless tobacco: Never Used  Substance Use Topics  . Alcohol use: No    Alcohol/week: 0.0 standard drinks    No family history on file.  ROS  Objective  Vitals as reported by the patient:   ASSESSMENT and PLAN  ***  FOLLOW-UP: ***   The above assessment and management plan was discussed with the patient. The patient verbalized understanding of and has agreed to the management plan. Patient is aware to call the clinic if symptoms persist or worsen. Patient is aware when to return to the clinic for a follow-up visit.  Patient educated on when it is appropriate to go to the emergency department.    I provided *** minutes of non-face-to-face time during this encounter.  Myles Lipps, MD Primary Care at [facility] [address] [place], [place] [address] Ph.  [phone] Fax [phone]

## 2020-02-02 ENCOUNTER — Telehealth: Payer: 59 | Admitting: Family Medicine

## 2020-04-26 ENCOUNTER — Ambulatory Visit (INDEPENDENT_AMBULATORY_CARE_PROVIDER_SITE_OTHER): Payer: 59

## 2020-04-26 ENCOUNTER — Ambulatory Visit: Payer: 59 | Admitting: Family Medicine

## 2020-04-26 ENCOUNTER — Encounter: Payer: Self-pay | Admitting: Family Medicine

## 2020-04-26 ENCOUNTER — Other Ambulatory Visit: Payer: Self-pay

## 2020-04-26 VITALS — BP 108/77 | HR 94 | Temp 98.4°F | Ht 63.75 in | Wt 155.4 lb

## 2020-04-26 DIAGNOSIS — G8929 Other chronic pain: Secondary | ICD-10-CM

## 2020-04-26 DIAGNOSIS — M4120 Other idiopathic scoliosis, site unspecified: Secondary | ICD-10-CM

## 2020-04-26 DIAGNOSIS — M533 Sacrococcygeal disorders, not elsewhere classified: Secondary | ICD-10-CM

## 2020-04-26 DIAGNOSIS — M5441 Lumbago with sciatica, right side: Secondary | ICD-10-CM | POA: Diagnosis not present

## 2020-04-26 DIAGNOSIS — M546 Pain in thoracic spine: Secondary | ICD-10-CM | POA: Diagnosis not present

## 2020-04-26 DIAGNOSIS — Z3202 Encounter for pregnancy test, result negative: Secondary | ICD-10-CM

## 2020-04-26 LAB — POCT URINE PREGNANCY: Preg Test, Ur: NEGATIVE

## 2020-04-26 MED ORDER — METHOCARBAMOL 500 MG PO TABS: 500.0000 mg | ORAL_TABLET | Freq: Three times a day (TID) | ORAL | 3 refills | Status: DC | PRN

## 2020-04-26 NOTE — Patient Instructions (Signed)
° ° ° °  If you have lab work done today you will be contacted with your lab results within the next 2 weeks.  If you have not heard from us then please contact us. The fastest way to get your results is to register for My Chart. ° ° °IF you received an x-ray today, you will receive an invoice from [facility]. Please contact [facility] at [phone] with questions or concerns regarding your invoice.  ° °IF you received labwork today, you will receive an invoice from [facility]. Please contact [facility] at [phone] with questions or concerns regarding your invoice.  ° °Our billing staff will not be able to assist you with questions regarding bills from these companies. ° °You will be contacted with the lab results as soon as they are available. The fastest way to get your results is to activate your My Chart account. Instructions are located on the last page of this paperwork. If you have not heard from us regarding the results in 2 weeks, please contact this office. °  ° ° ° °

## 2020-04-26 NOTE — Progress Notes (Signed)
5/17/202111:21 AM  Peggy Barker 12-08-1981, 39 y.o., female [id number]  Chief Complaint  Patient presents with  . Back Pain    has not taking the meds in one month did not help for pain and causes insomnia. She is asking for xray and PT  . FYI    will have pap nx appt    HPI:   Patient is a 39 y.o. female who presents today for followup on right upper back pain  Seen in jan 2021 - rx mobic and flexeril She reports daily pain, meds has not helped, has been using OTC CBD ointment which does help Denies any numbness or tingling but feels that pain/tightness radiates down her right arm Sometimes has neck pain Also having right sided low back pain that radiates down her right leg, no numbness or tingling, but leg feels tight She is requesting xrays and referral to PT  LMP April 27th 2021  Depression screen [facility] 2/9 01/29/2020 12/26/2019 09/17/2018  Decreased Interest 0 0 0  Down, Depressed, Hopeless 0 0 0  PHQ - 2 Score 0 0 0    Fall Risk  01/29/2020 12/26/2019 09/17/2018 04/24/2018  Falls in the past year? 0 0 No No  Number falls in past yr: 0 0 - -  Injury with Fall? 0 0 - -     No Known Allergies  Prior to Admission medications   Medication Sig Start Date End Date Taking? Authorizing Provider  cyclobenzaprine (FLEXERIL) 10 MG tablet Take 1 tablet (10 mg total) by mouth 3 (three) times daily as needed for muscle spasms. Patient not taking: Reported on 04/26/2020 12/26/19   Rutherford Guys, MD  meloxicam (MOBIC) 15 MG tablet Take 1 tablet (15 mg total) by mouth at bedtime as needed for pain. Patient not taking: Reported on 04/26/2020 12/26/19   Rutherford Guys, MD    Past Medical History:  Diagnosis Date  . GERD (gastroesophageal reflux disease)     No past surgical history on file.  Social History   Tobacco Use  . Smoking status: Never Smoker  . Smokeless tobacco: Never Used  Substance Use Topics  . Alcohol use: No    Alcohol/week: 0.0 standard drinks     Family History  Problem Relation Age of Onset  . Healthy Mother   . Healthy Sister     ROS Per hpi  OBJECTIVE:  Today's Vitals   04/26/20 1042  BP: 108/77  Pulse: 94  Temp: 98.4 F (36.9 C)  SpO2: 98%  Weight: 155 lb 6.4 oz (70.5 kg)  Height: 5' 3.75" (1.619 m)   Body mass index is 26.88 kg/m.   Physical Exam Vitals and nursing note reviewed.  Constitutional:      Appearance: She is well-developed.  HENT:     Head: Normocephalic and atraumatic.  Eyes:     General: No scleral icterus.    Conjunctiva/sclera: Conjunctivae normal.     Pupils: Pupils are equal, round, and reactive to light.  Pulmonary:     Effort: Pulmonary effort is normal.  Musculoskeletal:     Cervical back: Normal and neck supple.     Thoracic back: Spasms (right scapular border), tenderness and bony tenderness present. Normal range of motion.     Lumbar back: Spasms (right paraspinals) and tenderness (right si joint, + fabers) present. No bony tenderness. Normal range of motion. Negative right straight leg raise test and negative left straight leg raise test.     Right lower leg: No edema.  Left lower leg: No edema.  Skin:    General: Skin is warm and dry.  Neurological:     Mental Status: She is alert and oriented to person, place, and time.     Gait: Gait normal.     Deep Tendon Reflexes: Reflexes normal.     Results for orders placed or performed in visit on 04/26/20 (from the past 24 hour(s))  POCT urine pregnancy     Status: None   Collection Time: 04/26/20 11:48 AM  Result Value Ref Range   Preg Test, Ur Negative Negative    DG Thoracic Spine 2 View  Result Date: 04/26/2020 CLINICAL DATA:  Chronic back pain. EXAM: THORACIC SPINE 2 VIEWS COMPARISON:  None. FINDINGS: Mild curvature of the thoracic spine, apex at T7-T8, measuring 9 degrees. No fracture or bone lesion. No spondylolisthesis. Discs are well maintained in height. Soft tissues are unremarkable. IMPRESSION: 1. Minor  dextroscoliosis of the midthoracic spine. No other abnormality. Electronically Signed   By: Amie Portland M.D.   On: 04/26/2020 11:55   DG Lumbar Spine 2-3 Views  Result Date: 04/26/2020 CLINICAL DATA:  Chronic mid to lower back pain. EXAM: LUMBAR SPINE - 2-3 VIEW COMPARISON:  None. FINDINGS: There is no evidence of lumbar spine fracture. Alignment is normal. Intervertebral disc spaces are maintained. IMPRESSION: Negative. Electronically Signed   By: Amie Portland M.D.   On: 04/26/2020 11:56   DG Si Joints  Result Date: 04/26/2020 CLINICAL DATA:  Chronic right SI joint region pain. EXAM: BILATERAL SACROILIAC JOINTS - 3+ VIEW COMPARISON:  None. FINDINGS: The sacroiliac joint spaces are maintained and there is no evidence of arthropathy. No other bone abnormalities are seen. IMPRESSION: Negative. Electronically Signed   By: Amie Portland M.D.   On: 04/26/2020 11:56     ASSESSMENT and PLAN  1. Scoliosis (and kyphoscoliosis), idiopathic - Ambulatory referral to Physical Therapy  2. Chronic right-sided thoracic back pain - DG Thoracic Spine 2 View - Ambulatory referral to Physical Therapy  3. Chronic midline low back pain with right-sided sciatica - DG Lumbar Spine 2-3 Views - Ambulatory referral to Physical Therapy  4. Chronic right SI joint pain - DG Si Joints  5. Pregnancy examination or test, negative result - POCT urine pregnancy  Other orders - methocarbamol (ROBAXIN) 500 MG tablet; Take 1 tablet (500 mg total) by mouth every 8 (eight) hours as needed for muscle spasms.  Return if symptoms worsen or fail to improve.    Myles Lipps, MD Primary Care at [facility] [address] [place], [place] [address] Ph.  [phone] Fax [phone]

## 2020-04-29 ENCOUNTER — Ambulatory Visit: Payer: 59 | Attending: Family Medicine | Admitting: Physical Therapy

## 2020-04-29 ENCOUNTER — Encounter: Payer: Self-pay | Admitting: Physical Therapy

## 2020-04-29 ENCOUNTER — Other Ambulatory Visit: Payer: Self-pay

## 2020-04-29 DIAGNOSIS — M546 Pain in thoracic spine: Secondary | ICD-10-CM | POA: Diagnosis present

## 2020-04-29 DIAGNOSIS — G8929 Other chronic pain: Secondary | ICD-10-CM | POA: Diagnosis present

## 2020-04-29 DIAGNOSIS — M4124 Other idiopathic scoliosis, thoracic region: Secondary | ICD-10-CM | POA: Insufficient documentation

## 2020-04-29 DIAGNOSIS — M5441 Lumbago with sciatica, right side: Secondary | ICD-10-CM | POA: Insufficient documentation
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
## 2020-05-03 ENCOUNTER — Ambulatory Visit: Payer: 59 | Admitting: Physical Therapy

## 2020-05-03 ENCOUNTER — Encounter: Payer: Self-pay | Admitting: Physical Therapy

## 2020-05-03 ENCOUNTER — Other Ambulatory Visit: Payer: Self-pay

## 2020-05-03 DIAGNOSIS — M4124 Other idiopathic scoliosis, thoracic region: Secondary | ICD-10-CM

## 2020-05-03 DIAGNOSIS — M546 Pain in thoracic spine: Secondary | ICD-10-CM

## 2020-05-03 DIAGNOSIS — G8929 Other chronic pain: Secondary | ICD-10-CM

## 2020-05-03 NOTE — Therapy (Signed)
[place], [place], [address] Phone: [phone]   Fax:  [phone]  Physical Therapy Treatment  Patient Details  Name: Peggy Barker MRN: [id number] Date of Birth: [birth year] Referring Provider (PT): Grant Fontana, MD   Encounter Date: 05/03/2020  PT End of Session - 05/03/20 1801    Visit Number  2    Number of Visits  12    Date for PT Re-Evaluation  06/10/20    Authorization Type  UHC    PT Start Time  2947    PT Stop Time  1627    PT Time Calculation (min)  41 min    Activity Tolerance  Patient tolerated treatment well    Behavior During Therapy  [facility] for tasks assessed/performed       Past Medical History:  Diagnosis Date  . GERD (gastroesophageal reflux disease)     History reviewed. No pertinent surgical history.  There were no vitals filed for this visit.  Subjective Assessment - 05/03/20 1613    Subjective  Pt. wishing to focus on right thoracic region pain wihch is primary concern today. She defers dry needling preferring manual therapy focus. Pain 5-6/10 right rhomboid region. SI region doing a little better since eval.         [facility] PT Assessment - 05/03/20 0001      Palpation   SI assessment   no innominate rotation noted                    [facility] Adult PT Treatment/Exercise - 05/03/20 0001      Exercises   Exercises  Shoulder;Knee/Hip      Knee/Hip Exercises: Stretches   Piriformis Stretch  Right;3 reps;30 seconds    Piriformis Stretch Limitations  figure 4-noted some sharp pain in right hip with stretch in more hip flexion and adduction      Shoulder Exercises: Supine   Other Supine Exercises  supine thoracic extension over "pink" foam roll x 10 reps      Shoulder Exercises: Prone   Extension  AROM;Strengthening;Right;20 reps;Weights    Extension Weight (lbs)  2    Horizontal ABduction 1  AROM;Right;20 reps    Other Prone Exercises  prone row 3  lbs. 2x10 right UE      Manual Therapy   Manual Therapy  Soft tissue mobilization    Joint Mobilization  thoracic PAs grade I-III, LAD right hip grade I-III oscillations    Soft tissue mobilization  right rhomboids and thoracic paraspinals-focus trigger point ischemic compression             PT Education - 05/03/20 1801    Education Details  Theracane use    Person(s) Educated  Patient    Methods  Explanation    Comprehension  Verbalized understanding          PT Long Term Goals - 04/29/20 1608      PT LONG TERM GOAL #1   Title  Independent with HEP    Baseline  needs HEP    Time  6    Period  Weeks    Status  New    Target Date  06/10/20      PT LONG TERM GOAL #2   Title  Improve FOTO outcome measure score to 30% or less impairment    Baseline  40% limited    Time  6    Period  Weeks    Status  New  Target Date  06/10/20      PT LONG TERM GOAL #3   Title  Increase bilat. hip extension and abduction strength at least 1/2 MMT grade to improve hip/lumbopelvic stability to decrease LBP/SI joint pain    Baseline  hip ext 4/5, abd 4+/5    Time  6    Period  Weeks    Status  New    Target Date  06/10/20      PT LONG TERM GOAL #4   Title  Tolerate sitting/standing for periods at least 30-40 min with thoracic pain <3/10    Baseline  7-8/10 pain at worst    Time  6    Period  Weeks    Status  New    Target Date  06/10/20      PT LONG TERM GOAL #5   Title  (-) Repeat longsitting test for pelvic innominate rotation to improve positional tolerance currently impacted by SI pain    Baseline  (+) longsitting test for right posterior innominate rotation    Time  6    Period  Weeks    Status  New    Target Date  06/10/20            Plan - 05/03/20 1801    Clinical Impression Statement  Extensive manual focus today to right thoracic region to address trigger points/myofascial pain in right rhomboids and longissimus region. Some soreness with STM but overall  session well-tolerated-expect may take 1-2 days ot note tx. results after post-tx. soreness resolvesso will await further response by next session. Good response so far for SI joint region with response to METs at eval and HEP.    Personal Factors and Comorbidities  Time since onset of injury/illness/exacerbation;Other    Examination-Activity Limitations  Lift;Sit;Carry;Locomotion Level    Stability/Clinical Decision Making  Evolving/Moderate complexity    Clinical Decision Making  Moderate    Rehab Potential  Good    PT Frequency  --   1-2x/week   PT Duration  6 weeks    PT Treatment/Interventions  ADLs/Self Care Home Management;Cryotherapy;Electrical Stimulation;Ultrasound;Traction;Iontophoresis 4mg /ml Dexamethasone;Moist Heat;Therapeutic activities;Therapeutic exercise;Neuromuscular re-education;Patient/family education;Manual techniques;Dry needling;Taping;Spinal Manipulations    PT Next Visit Plan  review HEP as needed, check for innominate rotation and address prn with METs, STM and possible trial dry needling to right rhomboid and thoracic paraspinal region, postural strengthening and lumbopelvic/hip strengthening and stabilization, hamstring and piriformis flexibility    PT Home Exercise Plan  [id number]: left side trunk stretch with right sidelying over rolled pillow, Theraband rows, ext, horizontal abduction, pelvic tilt, clamshell, hip bridge, stretches for rhomboid, piriformis and hamstrings    Consulted and Agree with Plan of Care  Patient       Patient will benefit from skilled therapeutic intervention in order to improve the following deficits and impairments:  Pain, Postural dysfunction, Impaired flexibility, Decreased strength, Decreased activity tolerance, Increased muscle spasms  Visit Diagnosis: Other idiopathic scoliosis, thoracic region  Pain in thoracic spine  Chronic right-sided low back pain with right-sided sciatica     Problem List There are no problems to  display for this patient.   , PT, DPT 05/03/20 6:14 PM  [facility] [address] [place], [place], [place] Phone: [phone]   Fax:  [phone]  Name: Peggy Barker MRN: Lindalou Hose Date of Birth: 10-13-1981

## 2020-05-19 ENCOUNTER — Telehealth: Payer: Self-pay | Admitting: Physical Therapy

## 2020-05-19 ENCOUNTER — Ambulatory Visit: Payer: 59 | Attending: Family Medicine | Admitting: Physical Therapy

## 2020-05-19 DIAGNOSIS — M546 Pain in thoracic spine: Secondary | ICD-10-CM | POA: Insufficient documentation

## 2020-05-19 DIAGNOSIS — M5441 Lumbago with sciatica, right side: Secondary | ICD-10-CM | POA: Insufficient documentation

## 2020-05-19 DIAGNOSIS — M4124 Other idiopathic scoliosis, thoracic region: Secondary | ICD-10-CM | POA: Insufficient documentation

## 2020-05-19 DIAGNOSIS — G8929 Other chronic pain: Secondary | ICD-10-CM | POA: Insufficient documentation

## 2020-05-19 NOTE — Telephone Encounter (Signed)
Attempted to call regarding no show for 11:45 therapy appointment. Left voicemail with reminder next scheduled appointment time.

## 2020-05-21 ENCOUNTER — Other Ambulatory Visit: Payer: Self-pay

## 2020-05-21 ENCOUNTER — Ambulatory Visit: Payer: 59 | Admitting: Physical Therapy

## 2020-05-21 ENCOUNTER — Encounter: Payer: Self-pay | Admitting: Physical Therapy

## 2020-05-21 DIAGNOSIS — M4124 Other idiopathic scoliosis, thoracic region: Secondary | ICD-10-CM

## 2020-05-21 DIAGNOSIS — G8929 Other chronic pain: Secondary | ICD-10-CM

## 2020-05-21 DIAGNOSIS — M5441 Lumbago with sciatica, right side: Secondary | ICD-10-CM | POA: Diagnosis present

## 2020-05-21 DIAGNOSIS — M546 Pain in thoracic spine: Secondary | ICD-10-CM

## 2020-05-21 NOTE — Therapy (Signed)
[facility] [address] [place], [place], [address] Phone: [phone]   Fax:  [phone]  Physical Therapy Treatment  Patient Details  Name: Peggy Barker MRN: [id number] Date of Birth: [birth year] Referring Provider (PT): Koren Shiver, MD   Encounter Date: 05/21/2020   PT End of Session - 05/21/20 1050    Visit Number 3    Number of Visits 12    Date for PT Re-Evaluation 06/10/20    Authorization Type UHC    PT Start Time 1015    Activity Tolerance Patient tolerated treatment well    Behavior During Therapy [facility] for tasks assessed/performed           Past Medical History:  Diagnosis Date  . GERD (gastroesophageal reflux disease)     History reviewed. No pertinent surgical history.  There were no vitals filed for this visit.   Subjective Assessment - 05/21/20 1017    Subjective Pt. continues with right thoracic region pain 6-7/10. She reports some initial soreness after last session for a day or 2 then had some relief but pain subsequently returned. No significant low back symptoms. She is interested in massage for her upper back.    Pertinent History chronic symptom history otherwise no other significant PMH reported                             [facility] Adult PT Treatment/Exercise - 05/21/20 0001      Shoulder Exercises: Seated   Other Seated Exercises seated thoracic extension x 15 reps      Shoulder Exercises: Standing   Extension AROM;Strengthening;Both;20 reps    Theraband Level (Shoulder Extension) Level 3 (Green)    Row Limitations wide angle high row with blue Theraband 2x10    Diagonals AROM;Strengthening;Both;20 reps      Shoulder Exercises: Stretch   Other Shoulder Stretches right rhomboid stretch in doorway 3x30 sec      Manual Therapy   Joint Mobilization thoracic PAs grade I-III, LAD right hip grade I-III oscillations    Soft tissue mobilization right rhomboids and  thoracic paraspinals-focus trigger point ischemic compression                       PT Long Term Goals - 04/29/20 1608      PT LONG TERM GOAL #1   Title Independent with HEP    Baseline needs HEP    Time 6    Period Weeks    Status New    Target Date 06/10/20      PT LONG TERM GOAL #2   Title Improve FOTO outcome measure score to 30% or less impairment    Baseline 40% limited    Time 6    Period Weeks    Status New    Target Date 06/10/20      PT LONG TERM GOAL #3   Title Increase bilat. hip extension and abduction strength at least 1/2 MMT grade to improve hip/lumbopelvic stability to decrease LBP/SI joint pain    Baseline hip ext 4/5, abd 4+/5    Time 6    Period Weeks    Status New    Target Date 06/10/20      PT LONG TERM GOAL #4   Title Tolerate sitting/standing for periods at least 30-40 min with thoracic pain <3/10    Baseline 7-8/10 pain at worst    Time 6    Period  Weeks    Status New    Target Date 06/10/20      PT LONG TERM GOAL #5   Title (-) Repeat longsitting test for pelvic innominate rotation to improve positional tolerance currently impacted by SI pain    Baseline (+) longsitting test for right posterior innominate rotation    Time 6    Period Weeks    Status New    Target Date 06/10/20                 Plan - 05/21/20 1050    Clinical Impression Statement Mild temporary improvement after last session but subsequent return of symptoms for upper back-given symptom chronicity and underlying scoliosis expect more consistent relief will take more time. Progressing well with decreased lumbar symptoms. Discussed can include STM with PT sessions with other activities but if wanting massage focus there would be the option to see massage therapist for this.    Personal Factors and Comorbidities Time since onset of injury/illness/exacerbation;Other    Examination-Activity Limitations Lift;Sit;Carry;Locomotion Level    Stability/Clinical  Decision Making Evolving/Moderate complexity    Clinical Decision Making Moderate    Rehab Potential Good    PT Frequency --   1-2x/week   PT Duration 6 weeks    PT Treatment/Interventions ADLs/Self Care Home Management;Cryotherapy;Electrical Stimulation;Ultrasound;Traction;Iontophoresis 4mg /ml Dexamethasone;Moist Heat;Therapeutic activities;Therapeutic exercise;Neuromuscular re-education;Patient/family education;Manual techniques;Dry needling;Taping;Spinal Manipulations    PT Next Visit Plan focus upper thoracic region-postural exercises and thoracic ROM, manual as needed, prn check SI for rotation and review/progress lumbopelvic stabilization exercises    PT Home Exercise Plan [id number]: left side trunk stretch with right sidelying over rolled pillow, Theraband rows, ext, horizontal abduction, pelvic tilt, clamshell, hip bridge, stretches for rhomboid, piriformis and hamstrings    Consulted and Agree with Plan of Care Patient           Patient will benefit from skilled therapeutic intervention in order to improve the following deficits and impairments:  Pain, Postural dysfunction, Impaired flexibility, Decreased strength, Decreased activity tolerance, Increased muscle spasms  Visit Diagnosis: Other idiopathic scoliosis, thoracic region  Pain in thoracic spine  Chronic right-sided low back pain with right-sided sciatica     Problem List There are no problems to display for this patient.   Beaulah Dinning, PT, DPT 05/21/20 10:58 AM  [facility] [address] [place], [place], [address] Phone: [phone]   Fax:  [phone]  Name: Amaliya Whitelaw MRN: [id number] Date of Birth: [birth year]

## 2020-05-25 ENCOUNTER — Ambulatory Visit: Payer: 59 | Admitting: Physical Therapy

## 2020-05-25 ENCOUNTER — Encounter: Payer: Self-pay | Admitting: Physical Therapy

## 2020-05-25 ENCOUNTER — Other Ambulatory Visit: Payer: Self-pay

## 2020-05-25 DIAGNOSIS — M5441 Lumbago with sciatica, right side: Secondary | ICD-10-CM

## 2020-05-25 DIAGNOSIS — G8929 Other chronic pain: Secondary | ICD-10-CM

## 2020-05-25 DIAGNOSIS — M4124 Other idiopathic scoliosis, thoracic region: Secondary | ICD-10-CM | POA: Diagnosis not present

## 2020-05-25 DIAGNOSIS — M546 Pain in thoracic spine: Secondary | ICD-10-CM

## 2020-05-25 NOTE — Therapy (Signed)
[facility] Outpatient Rehabilitation [facility] [address] [place], [place], [address] Phone: [phone]   Fax:  [phone]  Physical Therapy Treatment  Patient Details  Name: Peggy Barker MRN: [id number] Date of Birth: Jan 16, 1981 Referring Provider (PT): Koren Shiver, MD   Encounter Date: 05/25/2020   PT End of Session - 05/25/20 1334    Visit Number 4    Number of Visits 12    Date for PT Re-Evaluation 06/10/20    Authorization Type UHC    PT Start Time 1102    PT Stop Time 1142    PT Time Calculation (min) 40 min    Activity Tolerance Patient tolerated treatment well    Behavior During Therapy [facility] for tasks assessed/performed           Past Medical History:  Diagnosis Date  . GERD (gastroesophageal reflux disease)     History reviewed. No pertinent surgical history.  There were no vitals filed for this visit.   Subjective Assessment - 05/25/20 1104    Subjective Pt. reports a little tight today in upper back but last session helpful. Lumbar/SI pain still improved.                             [facility] Adult PT Treatment/Exercise - 05/25/20 0001      Shoulder Exercises: Therapy Ball   Other Therapy Ball Exercises prone on P-ball bilat. rowas and horizontal abduction 2x10 ea.    Other Therapy Ball Exercises seated on P-ball Theraband horizontal abduction 2x10 with green band      Manual Therapy   Joint Mobilization thoracic PAs grade I-III, LAD right hip grade I-III oscillations    Soft tissue mobilization right rhomboids and thoracic paraspinals-focus trigger point ischemic compression                  PT Education - 05/25/20 1333    Education Details exercises-HEP options for P-ball use for upper back strengthening    Person(s) Educated Patient    Methods Explanation;Demonstration;Verbal cues    Comprehension Verbalized understanding;Returned demonstration               PT Long Term Goals -  04/29/20 1608      PT LONG TERM GOAL #1   Title Independent with HEP    Baseline needs HEP    Time 6    Period Weeks    Status New    Target Date 06/10/20      PT LONG TERM GOAL #2   Title Improve FOTO outcome measure score to 30% or less impairment    Baseline 40% limited    Time 6    Period Weeks    Status New    Target Date 06/10/20      PT LONG TERM GOAL #3   Title Increase bilat. hip extension and abduction strength at least 1/2 MMT grade to improve hip/lumbopelvic stability to decrease LBP/SI joint pain    Baseline hip ext 4/5, abd 4+/5    Time 6    Period Weeks    Status New    Target Date 06/10/20      PT LONG TERM GOAL #4   Title Tolerate sitting/standing for periods at least 30-40 min with thoracic pain <3/10    Baseline 7-8/10 pain at worst    Time 6    Period Weeks    Status New    Target Date 06/10/20  PT LONG TERM GOAL #5   Title (-) Repeat longsitting test for pelvic innominate rotation to improve positional tolerance currently impacted by SI pain    Baseline (+) longsitting test for right posterior innominate rotation    Time 6    Period Weeks    Status New    Target Date 06/10/20                 Plan - 05/25/20 1334    Clinical Impression Statement Tx. focus continued work on thoracic region pain with manual therapy and exercises. Pt. has P-ball at home so worked on upper back strengthening options for ball use. Stil with trigger points in right thoracic/rhomboid region but thus far responding well to tx. for decreased pain.    Personal Factors and Comorbidities Time since onset of injury/illness/exacerbation;Other    Examination-Activity Limitations Lift;Sit;Carry;Locomotion Level    Stability/Clinical Decision Making Evolving/Moderate complexity    Clinical Decision Making Moderate    Rehab Potential Good    PT Frequency --   1-2x/week   PT Duration 6 weeks    PT Treatment/Interventions ADLs/Self Care Home  Management;Cryotherapy;Electrical Stimulation;Ultrasound;Traction;Iontophoresis 4mg /ml Dexamethasone;Moist Heat;Therapeutic activities;Therapeutic exercise;Neuromuscular re-education;Patient/family education;Manual techniques;Dry needling;Taping;Spinal Manipulations    PT Next Visit Plan focus upper thoracic region-postural exercises and thoracic ROM, manual as needed, prn check SI for rotation and review/progress lumbopelvic stabilization exercises    PT Home Exercise Plan [id number]: left side trunk stretch with right sidelying over rolled pillow, Theraband rows, ext, horizontal abduction, pelvic tilt, clamshell, hip bridge, stretches for rhomboid, piriformis and hamstrings, prone on ball rows and horizontal abduction    Consulted and Agree with Plan of Care Patient           Patient will benefit from skilled therapeutic intervention in order to improve the following deficits and impairments:  Pain, Postural dysfunction, Impaired flexibility, Decreased strength, Decreased activity tolerance, Increased muscle spasms  Visit Diagnosis: Other idiopathic scoliosis, thoracic region  Pain in thoracic spine  Chronic right-sided low back pain with right-sided sciatica     Problem List There are no problems to display for this patient.   Beaulah Dinning, PT, DPT 05/25/20 1:37 PM  [facility] [address] [place], [place], [address] Phone: [phone]   Fax:  [phone]  Name: Celine Dishman MRN: [id number] Date of Birth: 1981/08/28

## 2020-05-27 ENCOUNTER — Encounter: Payer: Self-pay | Admitting: Physical Therapy

## 2020-05-27 ENCOUNTER — Other Ambulatory Visit: Payer: Self-pay

## 2020-05-27 ENCOUNTER — Ambulatory Visit: Payer: 59 | Admitting: Physical Therapy

## 2020-05-27 DIAGNOSIS — M4124 Other idiopathic scoliosis, thoracic region: Secondary | ICD-10-CM | POA: Diagnosis not present

## 2020-05-27 DIAGNOSIS — M546 Pain in thoracic spine: Secondary | ICD-10-CM

## 2020-05-27 DIAGNOSIS — M5441 Lumbago with sciatica, right side: Secondary | ICD-10-CM

## 2020-05-27 NOTE — Therapy (Signed)
[facility] Outpatient Rehabilitation [facility] [address] [place], [place], [address] Phone: [phone]   Fax:  [phone]  Physical Therapy Treatment  Patient Details  Name: Peggy Barker MRN: [id number] Date of Birth: 1981/03/26 Referring Provider (PT): Koren Shiver, MD   Encounter Date: 05/27/2020   PT End of Session - 05/27/20 1818    Visit Number 5    Number of Visits 12    Date for PT Re-Evaluation 06/10/20    Authorization Type UHC    PT Start Time 1715    PT Stop Time 1754    PT Time Calculation (min) 39 min    Activity Tolerance Patient tolerated treatment well    Behavior During Therapy [facility] for tasks assessed/performed           Past Medical History:  Diagnosis Date  . GERD (gastroesophageal reflux disease)     History reviewed. No pertinent surgical history.  There were no vitals filed for this visit.   Subjective Assessment - 05/27/20 1816    Subjective No pain pre-tx. today-reports back has been improving. Primary recent symptoms still upper>lower back.                             [facility] Adult PT Treatment/Exercise - 05/27/20 0001      Shoulder Exercises: Prone   Other Prone Exercises right side: prone row and ext 2 lbs. 2x10 ea., horizontal abduction 2x10 bodyweight      Shoulder Exercises: Stretch   Other Shoulder Stretches standing right trunk stretch in doorway/brief practice for HEP    Other Shoulder Stretches supine thoracic extension over pink foam roll x 10 reps      Manual Therapy   Joint Mobilization thoracic PAs grade I-III, LAD right hip grade I-III oscillations    Soft tissue mobilization right rhomboids and thoracic paraspinals-focus trigger point ischemic compression                  PT Education - 05/27/20 1817    Education Details spine anatomy/scoliosis etiology, exercises    Person(s) Educated Patient    Methods Explanation;Demonstration;Verbal cues    Comprehension  Verbalized understanding;Returned demonstration               PT Long Term Goals - 04/29/20 1608      PT LONG TERM GOAL #1   Title Independent with HEP    Baseline needs HEP    Time 6    Period Weeks    Status New    Target Date 06/10/20      PT LONG TERM GOAL #2   Title Improve FOTO outcome measure score to 30% or less impairment    Baseline 40% limited    Time 6    Period Weeks    Status New    Target Date 06/10/20      PT LONG TERM GOAL #3   Title Increase bilat. hip extension and abduction strength at least 1/2 MMT grade to improve hip/lumbopelvic stability to decrease LBP/SI joint pain    Baseline hip ext 4/5, abd 4+/5    Time 6    Period Weeks    Status New    Target Date 06/10/20      PT LONG TERM GOAL #4   Title Tolerate sitting/standing for periods at least 30-40 min with thoracic pain <3/10    Baseline 7-8/10 pain at worst    Time 6    Period Weeks  Status New    Target Date 06/10/20      PT LONG TERM GOAL #5   Title (-) Repeat longsitting test for pelvic innominate rotation to improve positional tolerance currently impacted by SI pain    Baseline (+) longsitting test for right posterior innominate rotation    Time 6    Period Weeks    Status New    Target Date 06/10/20                 Plan - 05/27/20 1818    Clinical Impression Statement Continued previous tx. focus given benefit noted from manual therapy with inclusion also thoracic ROM/stretches and continued periscapular strengthening. Progressing well re: therapy goals with decreased pain and improving positional tolerance.    Personal Factors and Comorbidities Time since onset of injury/illness/exacerbation;Other    Examination-Activity Limitations Lift;Sit;Carry;Locomotion Level    Stability/Clinical Decision Making Stable/Uncomplicated    Clinical Decision Making Moderate    Rehab Potential Good    PT Frequency --   1-2x/week   PT Duration 6 weeks    PT Treatment/Interventions  ADLs/Self Care Home Management;Cryotherapy;Electrical Stimulation;Ultrasound;Traction;Iontophoresis 4mg /ml Dexamethasone;Moist Heat;Therapeutic activities;Therapeutic exercise;Neuromuscular re-education;Patient/family education;Manual techniques;Dry needling;Taping;Spinal Manipulations    PT Next Visit Plan focus upper thoracic region-postural exercises and thoracic ROM, manual as needed, prn check SI for rotation and review/progress lumbopelvic stabilization exercises    PT Home Exercise Plan [id number]: left side trunk stretch with right sidelying over rolled pillow, Theraband rows, ext, horizontal abduction, pelvic tilt, clamshell, hip bridge, stretches for rhomboid, piriformis and hamstrings, prone on ball rows and horizontal abduction    Consulted and Agree with Plan of Care Patient           Patient will benefit from skilled therapeutic intervention in order to improve the following deficits and impairments:  Pain, Postural dysfunction, Impaired flexibility, Decreased strength, Decreased activity tolerance, Increased muscle spasms  Visit Diagnosis: Other idiopathic scoliosis, thoracic region  Pain in thoracic spine  Chronic right-sided low back pain with right-sided sciatica     Problem List There are no problems to display for this patient.   Beaulah Dinning, PT, DPT 05/27/20 6:20 PM  [facility] [address] [place], [place], [address] Phone: [phone]   Fax:  [phone]  Name: Peggy Barker MRN: [id number] Date of Birth: October 18, 1981

## 2020-05-31 ENCOUNTER — Other Ambulatory Visit: Payer: Self-pay

## 2020-05-31 ENCOUNTER — Encounter: Payer: Self-pay | Admitting: Physical Therapy

## 2020-05-31 ENCOUNTER — Ambulatory Visit: Payer: 59 | Admitting: Physical Therapy

## 2020-05-31 DIAGNOSIS — M4124 Other idiopathic scoliosis, thoracic region: Secondary | ICD-10-CM | POA: Diagnosis not present

## 2020-05-31 DIAGNOSIS — G8929 Other chronic pain: Secondary | ICD-10-CM

## 2020-05-31 DIAGNOSIS — M546 Pain in thoracic spine: Secondary | ICD-10-CM

## 2020-05-31 NOTE — Therapy (Signed)
[place], [place], [address] Phone: [phone]   Fax:  [phone]  Physical Therapy Treatment  Patient Details  Name: Peggy Barker MRN: [id number] Date of Birth: June 23, 1981 Referring Provider (PT): Grant Fontana, MD   Encounter Date: 05/31/2020   PT End of Session - 05/31/20 1812    Visit Number 6    Number of Visits 12    Date for PT Re-Evaluation 06/10/20    Authorization Type UHC    PT Start Time 1632    PT Stop Time 1710    PT Time Calculation (min) 38 min    Activity Tolerance Patient tolerated treatment well    Behavior During Therapy [facility] for tasks assessed/performed           Past Medical History:  Diagnosis Date  . GERD (gastroesophageal reflux disease)     History reviewed. No pertinent surgical history.  There were no vitals filed for this visit.   Subjective Assessment - 05/31/20 1809    Subjective Pt. reports that her back has been doing better. No new complaints/concerns otherwise. See assessment/plan.    Currently in Pain? No/denies                             [facility] Adult PT Treatment/Exercise - 05/31/20 0001      Shoulder Exercises: ROM/Strengthening   Lat Pull Limitations 2x10 wit 25 lbs.    Cybex Row Limitations 2x10 with 35 lb. vertical grip, 1x10 wide grip with 25 lbs.    Other ROM/Strengthening Exercises TRX row 2x10, also reviewed/instructed TRX horizontal abduction      Manual Therapy   Joint Mobilization thoracic PAs grade I-III, LAD right hip grade I-III oscillations    Soft tissue mobilization right rhomboids and thoracic paraspinals, right periscapular region-focus trigger point ischemic compression                  PT Education - 05/31/20 1811    Education Details gym exercises, POC    Person(s) Educated Patient    Methods Explanation;Demonstration;Tactile cues;Verbal cues    Comprehension Returned  demonstration;Verbalized understanding;Tactile cues required;Verbal cues required               PT Long Term Goals - 04/29/20 1608      PT LONG TERM GOAL #1   Title Independent with HEP    Baseline needs HEP    Time 6    Period Weeks    Status New    Target Date 06/10/20      PT LONG TERM GOAL #2   Title Improve FOTO outcome measure score to 30% or less impairment    Baseline 40% limited    Time 6    Period Weeks    Status New    Target Date 06/10/20      PT LONG TERM GOAL #3   Title Increase bilat. hip extension and abduction strength at least 1/2 MMT grade to improve hip/lumbopelvic stability to decrease LBP/SI joint pain    Baseline hip ext 4/5, abd 4+/5    Time 6    Period Weeks    Status New    Target Date 06/10/20      PT LONG TERM GOAL #4   Title Tolerate sitting/standing for periods at least 30-40 min with thoracic pain <3/10    Baseline 7-8/10 pain at worst    Time 6    Period Weeks  Status New    Target Date 06/10/20      PT LONG TERM GOAL #5   Title (-) Repeat longsitting test for pelvic innominate rotation to improve positional tolerance currently impacted by SI pain    Baseline (+) longsitting test for right posterior innominate rotation    Time 6    Period Weeks    Status New    Target Date 06/10/20                 Plan - 05/31/20 1812    Clinical Impression Statement Good progress for improved right upper back pain and lumbar symptoms still improved from previous status. Pt. had been scheduled for 3 more follow up visits after today's session-given improvement cancelled 2 of 3 remaining visits with visit left on schedule for next Thursday-plan have pt. try continuing via HEP and if doing well she can call and cancel visit otherwise plan d/c next session. Pt. had gym membership prior to pandemic and plans on returning so exercises today focused on instruction of potential gym equipment to use to continue to work on back + postural  strengthening.    Personal Factors and Comorbidities Time since onset of injury/illness/exacerbation;Other    Examination-Activity Limitations Lift;Sit;Carry;Locomotion Level    Stability/Clinical Decision Making Stable/Uncomplicated    Rehab Potential Good    PT Frequency --   1-2x/week   PT Duration 6 weeks    PT Treatment/Interventions ADLs/Self Care Home Management;Cryotherapy;Electrical Stimulation;Ultrasound;Traction;Iontophoresis 4mg /ml Dexamethasone;Moist Heat;Therapeutic activities;Therapeutic exercise;Neuromuscular re-education;Patient/family education;Manual techniques;Dry needling;Taping;Spinal Manipulations    PT Next Visit Plan tentative d/c next visit-review/update HEP as needed, manual to right thoracic region    PT Home Exercise Plan [id number]: left side trunk stretch with right sidelying over rolled pillow, Theraband rows, ext, horizontal abduction, pelvic tilt, clamshell, hip bridge, stretches for rhomboid, piriformis and hamstrings, prone on ball rows and horizontal abduction, for gym exercises instructed in row and horizontal abduction with machine vs. TRX, lat pulldown    Consulted and Agree with Plan of Care Patient           Patient will benefit from skilled therapeutic intervention in order to improve the following deficits and impairments:  Pain, Postural dysfunction, Impaired flexibility, Decreased strength, Decreased activity tolerance, Increased muscle spasms  Visit Diagnosis: Other idiopathic scoliosis, thoracic region  Pain in thoracic spine  Chronic right-sided low back pain with right-sided sciatica     Problem List There are no problems to display for this patient.   , PT, DPT 05/31/20 6:18 PM  [facility] [address] [place], [place], [place] Phone: [phone]   Fax:  [phone]  Name: Peggy Barker MRN: Peggy Barker Date of Birth: Sep 28, 1981

## 2020-06-03 ENCOUNTER — Ambulatory Visit: Payer: 59 | Admitting: Physical Therapy

## 2020-06-07 ENCOUNTER — Ambulatory Visit: Payer: 59 | Admitting: Physical Therapy

## 2020-06-10 ENCOUNTER — Telehealth: Payer: Self-pay | Admitting: Physical Therapy

## 2020-06-10 ENCOUNTER — Ambulatory Visit: Payer: 59 | Attending: Family Medicine | Admitting: Physical Therapy

## 2020-06-10 NOTE — Telephone Encounter (Signed)
Attempted to call patient regarding no show for therapy appointment this PM. Number in chart not working/no ringtone or voicemail so unable to reach.

## 2020-07-05 NOTE — Therapy (Signed)
[place], [place], [address] Phone: [phone]   Fax:  [phone]  Physical Therapy Treatment/Discharge  Patient Details  Name: Peggy Barker MRN: [id number] Date of Birth: 1981-05-17 Referring Provider (PT): Grant Fontana, MD   Encounter Date: 05/31/2020    Past Medical History:  Diagnosis Date  . GERD (gastroesophageal reflux disease)     History reviewed. No pertinent surgical history.  There were no vitals filed for this visit.                                   PT Long Term Goals - 04/29/20 1608      PT LONG TERM GOAL #1   Title Independent with HEP    Baseline needs HEP    Time 6    Period Weeks    Status New    Target Date 06/10/20      PT LONG TERM GOAL #2   Title Improve FOTO outcome measure score to 30% or less impairment    Baseline 40% limited    Time 6    Period Weeks    Status New    Target Date 06/10/20      PT LONG TERM GOAL #3   Title Increase bilat. hip extension and abduction strength at least 1/2 MMT grade to improve hip/lumbopelvic stability to decrease LBP/SI joint pain    Baseline hip ext 4/5, abd 4+/5    Time 6    Period Weeks    Status New    Target Date 06/10/20      PT LONG TERM GOAL #4   Title Tolerate sitting/standing for periods at least 30-40 min with thoracic pain <3/10    Baseline 7-8/10 pain at worst    Time 6    Period Weeks    Status New    Target Date 06/10/20      PT LONG TERM GOAL #5   Title (-) Repeat longsitting test for pelvic innominate rotation to improve positional tolerance currently impacted by SI pain    Baseline (+) longsitting test for right posterior innominate rotation    Time 6    Period Weeks    Status New    Target Date 06/10/20                  Patient will benefit from skilled therapeutic intervention in order to improve the following deficits and impairments:  Pain,  Postural dysfunction, Impaired flexibility, Decreased strength, Decreased activity tolerance, Increased muscle spasms  Visit Diagnosis: Other idiopathic scoliosis, thoracic region  Pain in thoracic spine  Chronic right-sided low back pain with right-sided sciatica     Problem List There are no problems to display for this patient.      PHYSICAL THERAPY DISCHARGE SUMMARY  Visits from Start of Care: 6  Current functional level related to goals / functional outcomes: Patient did not return for further therapy after last visit 05/31/20. Unable to update final objective status as she missed her last scheduled therapy appointment after. As of last visit she was noting improvement with both lumbar and thoracic pain.   Remaining deficits: NA   Education / Equipment: HEP Plan: Patient agrees to discharge.  Patient goals were not met. Patient is being discharged due to not returning since the last visit.  ?????           Beaulah Dinning, PT, DPT 07/05/20 12:25  PM     [place], [place], [address] Phone: [phone]   Fax:  [phone]  Name: Peggy Barker MRN: [id number] Date of Birth: [birth year]

## 2020-07-23 ENCOUNTER — Other Ambulatory Visit: Payer: Self-pay

## 2020-07-23 ENCOUNTER — Ambulatory Visit: Payer: 59 | Admitting: Family Medicine

## 2020-07-23 ENCOUNTER — Encounter: Payer: Self-pay | Admitting: Family Medicine

## 2020-07-23 VITALS — BP 93/60 | HR 85 | Temp 98.2°F | Ht 65.0 in | Wt 160.4 lb

## 2020-07-23 DIAGNOSIS — M4120 Other idiopathic scoliosis, site unspecified: Secondary | ICD-10-CM | POA: Diagnosis not present

## 2020-07-23 DIAGNOSIS — G8929 Other chronic pain: Secondary | ICD-10-CM

## 2020-07-23 DIAGNOSIS — M546 Pain in thoracic spine: Secondary | ICD-10-CM | POA: Diagnosis not present

## 2020-07-23 MED ORDER — TIZANIDINE HCL 4 MG PO TABS: 4.0000 mg | ORAL_TABLET | Freq: Three times a day (TID) | ORAL | 2 refills | Status: DC | PRN

## 2020-07-23 NOTE — Progress Notes (Signed)
8/13/20214:35 PM  Peggy Barker 12/26/1980, 39 y.o., female [id number]  Chief Complaint  Patient presents with  . upper back pain    hurt over a year     HPI:   Patient is a 39 y.o. female who presents for back pain  She was seen in may for similar issues - scoliosis rx methocarbamol and referred to PT  She attended PT for 2-3 weeks but was discharged  She has been doing HEP She has been doing well until 3-4 days had spasm right trapezius Treating with ice/heat, tumeric, better today Denies any numbness or tingling No focal weakness Methocarbamol caused insomnia She is wondering about massage therapy She would like to go to a different PT  Depression screen [facility] 2/9 07/23/2020 01/29/2020 12/26/2019  Decreased Interest 0 0 0  Down, Depressed, Hopeless 0 0 0  PHQ - 2 Score 0 0 0    Fall Risk  07/23/2020 01/29/2020 12/26/2019 09/17/2018 04/24/2018  Falls in the past year? 0 0 0 No No  Number falls in past yr: 0 0 0 - -  Injury with Fall? 0 0 0 - -  Follow up Falls evaluation completed - - - -     No Known Allergies  Prior to Admission medications   Medication Sig Start Date End Date Taking? Authorizing Provider  Turmeric (QC TUMERIC COMPLEX PO) Take by mouth.   Yes [provider]  methocarbamol (ROBAXIN) 500 MG tablet Take 1 tablet (500 mg total) by mouth every 8 (eight) hours as needed for muscle spasms. Patient not taking: Reported on 05/25/2020 04/26/20   Myles Lipps, MD    Past Medical History:  Diagnosis Date  . GERD (gastroesophageal reflux disease)     No past surgical history on file.  Social History   Tobacco Use  . Smoking status: Never Smoker  . Smokeless tobacco: Never Used  Substance Use Topics  . Alcohol use: No    Alcohol/week: 0.0 standard drinks    Family History  Problem Relation Age of Onset  . Healthy Mother   . Healthy Sister     ROS Per hpi  OBJECTIVE:  Today's Vitals   07/23/20 1620 07/23/20 1622  BP:  99/68 93/60  Pulse: 85   Temp: 98.2 F (36.8 C)   TempSrc: Temporal   SpO2: 96%   Weight: 160 lb 6.4 oz (72.8 kg)   Height: 5\' 5"  (1.651 m)    Body mass index is 26.69 kg/m.   Physical Exam Vitals and nursing note reviewed.  Constitutional:      Appearance: She is well-developed.  HENT:     Head: Normocephalic and atraumatic.  Eyes:     General: No scleral icterus.    Conjunctiva/sclera: Conjunctivae normal.     Pupils: Pupils are equal, round, and reactive to light.  Pulmonary:     Effort: Pulmonary effort is normal.  Musculoskeletal:     Cervical back: Normal and neck supple.     Thoracic back: Spasms (right scapular border) and tenderness present. No bony tenderness. Normal range of motion. Scoliosis present.     Right lower leg: No edema.     Left lower leg: No edema.  Skin:    General: Skin is warm and dry.  Neurological:     Mental Status: She is alert and oriented to person, place, and time.     Gait: Gait normal.     Deep Tendon Reflexes: Reflexes normal.     No results found  for this or any previous visit (from the past 24 hour(s)).  No results found.   ASSESSMENT and PLAN  1. Chronic right-sided thoracic back pain 2. Scoliosis (and kyphoscoliosis), idiopathic Cont with conservative measures. Trial of tizanidine, reviewed r/se/b. Provided info re MT - Ambulatory referral to Physical Therapy - breakthrough PT  Other orders - tiZANidine (ZANAFLEX) 4 MG tablet; Take 1 tablet (4 mg total) by mouth every 8 (eight) hours as needed for muscle spasms.  No follow-ups on file.    Myles Lipps, MD Primary Care at [facility] [address] [place], [place] [address] Ph.  [phone] Fax [phone]

## 2020-07-23 NOTE — Patient Instructions (Addendum)
  [employer] [address] [place], [place], [place] [address] Phone: [phone]  Referred to [facility]   If you have lab work done today you will be contacted with your lab results within the next 2 weeks.  If you have not heard from [place] then please contact us. The fastest way to get your results is to register for My Chart.   IF you received an x-ray today, you will receive an invoice from [facility]. Please contact [facility] at [phone] with questions or concerns regarding your invoice.   IF you received labwork today, you will receive an invoice from [facility]. Please contact [facility] at [phone] with questions or concerns regarding your invoice.   Our billing staff will not be able to assist you with questions regarding bills from these companies.  You will be contacted with the lab results as soon as they are available. The fastest way to get your results is to activate your My Chart account. Instructions are located on the last page of this paperwork. If you have not heard from [place] regarding the results in 2 weeks, please contact this office.

## 2020-09-01 ENCOUNTER — Encounter: Payer: Self-pay | Admitting: Registered Nurse

## 2020-09-01 ENCOUNTER — Other Ambulatory Visit: Payer: Self-pay

## 2020-09-01 ENCOUNTER — Ambulatory Visit (INDEPENDENT_AMBULATORY_CARE_PROVIDER_SITE_OTHER): Payer: 59 | Admitting: Registered Nurse

## 2020-09-01 VITALS — BP 107/77 | HR 89 | Temp 98.6°F | Resp 18 | Ht 65.0 in | Wt 164.6 lb

## 2020-09-01 DIAGNOSIS — T7840XA Allergy, unspecified, initial encounter: Secondary | ICD-10-CM

## 2020-09-01 DIAGNOSIS — R21 Rash and other nonspecific skin eruption: Secondary | ICD-10-CM

## 2020-09-01 MED ORDER — METHYLPREDNISOLONE ACETATE 40 MG/ML IJ SUSP
40.0000 mg | Freq: Once | INTRAMUSCULAR | Status: AC
  Administered 2020-09-01: 40 mg via INTRAMUSCULAR

## 2020-09-01 NOTE — Progress Notes (Signed)
Acute Office Visit  Subjective:    Patient ID: Peggy Barker, female    DOB: [birth year], 39 y.o.   MRN: [id number]  Chief Complaint  Patient presents with  . Rash    Patient states since saturday she has had an rash on her hands. Per patient she believes its from some new hand sanitizer and its starting to itch now. SHe has tried OTC creams and some Benadryl with no relief    HPI Patient is in today for rash on both hands  Onset Saturday Recently traveled for a conference. Was using different hand sanitizer. This started soon after Vesicular rash on dorsal surface of both hands that extends with less intensity up forearms. Palms not involved Itchy and irritated. Has been taking otc allergy meds and hydrocortisone which has helped but not provided total relief  No systemic symptoms No other triggers Has not happened before  Past Medical History:  Diagnosis Date  . GERD (gastroesophageal reflux disease)     No past surgical history on file.  Family History  Problem Relation Age of Onset  . Healthy Mother   . Healthy Sister     Social History   Socioeconomic History  . Marital status: Married    Spouse name: Not on file  . Number of children: Not on file  . Years of education: Not on file  . Highest education level: Not on file  Occupational History  . Not on file  Tobacco Use  . Smoking status: Never Smoker  . Smokeless tobacco: Never Used  Substance and Sexual Activity  . Alcohol use: No    Alcohol/week: 0.0 standard drinks  . Drug use: No  . Sexual activity: Yes    Comment: INTERCOURSE 20'S SEXUAL PARTNER 1  Other Topics Concern  . Not on file  Social History Narrative  . Not on file   Social Determinants of Health   Financial Resource Strain:   . Difficulty of Paying Living Expenses: Not on file  Food Insecurity:   . Worried About Programme researcher, broadcasting/film/video in the Last Year: Not on file  . Ran Out of Food in the Last Year: Not on file    Transportation Needs:   . Lack of Transportation (Medical): Not on file  . Lack of Transportation (Non-Medical): Not on file  Physical Activity:   . Days of Exercise per Week: Not on file  . Minutes of Exercise per Session: Not on file  Stress:   . Feeling of Stress : Not on file  Social Connections:   . Frequency of Communication with Friends and Family: Not on file  . Frequency of Social Gatherings with Friends and Family: Not on file  . Attends Religious Services: Not on file  . Active Member of Clubs or Organizations: Not on file  . Attends Banker Meetings: Not on file  . Marital Status: Not on file  Intimate Partner Violence:   . Fear of Current or Ex-Partner: Not on file  . Emotionally Abused: Not on file  . Physically Abused: Not on file  . Sexually Abused: Not on file    Outpatient Medications Prior to Visit  Medication Sig Dispense Refill  . methocarbamol (ROBAXIN) 500 MG tablet Take 1 tablet (500 mg total) by mouth every 8 (eight) hours as needed for muscle spasms. (Patient not taking: Reported on 05/25/2020) 90 tablet 3  . tiZANidine (ZANAFLEX) 4 MG tablet Take 1 tablet (4 mg total) by mouth every 8 (eight) hours  as needed for muscle spasms. (Patient not taking: Reported on 09/01/2020) 90 tablet 2  . Turmeric (QC TUMERIC COMPLEX PO) Take by mouth. (Patient not taking: Reported on 09/01/2020)     No facility-administered medications prior to visit.    No Known Allergies  Review of Systems Per hpi      Objective:    Physical Exam Vitals and nursing note reviewed.  Constitutional:      General: She is not in acute distress.    Appearance: Normal appearance. She is not ill-appearing, toxic-appearing or diaphoretic.  Cardiovascular:     Rate and Rhythm: Normal rate and regular rhythm.     Pulses: Normal pulses.  Pulmonary:     Effort: Pulmonary effort is normal. No respiratory distress.  Skin:    General: Skin is warm and dry.     Findings: Rash  present. Rash is vesicular.       Neurological:     General: No focal deficit present.     Mental Status: She is alert and oriented to person, place, and time. Mental status is at baseline.  Psychiatric:        Mood and Affect: Mood normal.        Behavior: Behavior normal.        Thought Content: Thought content normal.        Judgment: Judgment normal.     BP 107/77   Pulse 89   Temp 98.6 F (37 C) (Temporal)   Resp 18   Ht 5\' 5"  (1.651 m)   Wt 164 lb 9.6 oz (74.7 kg)   SpO2 98%   BMI 27.39 kg/m  Wt Readings from Last 3 Encounters:  09/01/20 164 lb 9.6 oz (74.7 kg)  07/23/20 160 lb 6.4 oz (72.8 kg)  04/26/20 155 lb 6.4 oz (70.5 kg)    There are no preventive care reminders to display for this patient.  There are no preventive care reminders to display for this patient.   No results found for: TSH No results found for: WBC, HGB, HCT, MCV, PLT Lab Results  Component Value Date   NA 140 04/24/2018   K 4.1 04/24/2018   CO2 21 04/24/2018   GLUCOSE 88 04/24/2018   BUN 7 04/24/2018   CREATININE 0.71 04/24/2018   BILITOT 0.4 04/24/2018   ALKPHOS 60 04/24/2018   AST 23 04/24/2018   ALT 21 04/24/2018   PROT 6.8 04/24/2018   ALBUMIN 4.3 04/24/2018   CALCIUM 9.7 04/24/2018   No results found for: CHOL No results found for: HDL No results found for: LDLCALC No results found for: TRIG No results found for: CHOLHDL No results found for: 04/26/2018     Assessment & Plan:   Problem List Items Addressed This Visit    None    Visit Diagnoses    Rash due to allergy    -  Primary   Relevant Medications   methylPREDNISolone acetate (DEPO-MEDROL) injection 40 mg (Completed)       Meds ordered this encounter  Medications  . methylPREDNISolone acetate (DEPO-MEDROL) injection 40 mg   PLAN  Depo medrol injection  Continue using OTCs  Return if worsening or failing to improve  Patient encouraged to call clinic with any questions, comments, or  concerns.   [id number], NP

## 2020-09-01 NOTE — Patient Instructions (Signed)
° ° ° °  If you have lab work done today you will be contacted with your lab results within the next 2 weeks.  If you have not heard from us then please contact us. The fastest way to get your results is to register for My Chart. ° ° °IF you received an x-ray today, you will receive an invoice from [facility]. Please contact [facility] at [phone] with questions or concerns regarding your invoice.  ° °IF you received labwork today, you will receive an invoice from [facility]. Please contact [facility] at [phone] with questions or concerns regarding your invoice.  ° °Our billing staff will not be able to assist you with questions regarding bills from these companies. ° °You will be contacted with the lab results as soon as they are available. The fastest way to get your results is to activate your My Chart account. Instructions are located on the last page of this paperwork. If you have not heard from us regarding the results in 2 weeks, please contact this office. °  ° ° ° °

## 2020-10-19 ENCOUNTER — Other Ambulatory Visit (HOSPITAL_COMMUNITY)
Admission: RE | Admit: 2020-10-19 | Discharge: 2020-10-19 | Disposition: A | Discharge status: Home / Self Care | Payer: 59 | Source: Ambulatory Visit | Attending: Family Medicine | Admitting: Family Medicine

## 2020-10-19 ENCOUNTER — Other Ambulatory Visit: Payer: Self-pay

## 2020-10-19 ENCOUNTER — Ambulatory Visit: Payer: 59 | Admitting: Family Medicine

## 2020-10-19 ENCOUNTER — Encounter: Payer: Self-pay | Admitting: Family Medicine

## 2020-10-19 VITALS — BP 113/78 | HR 95 | Temp 98.4°F | Ht 65.0 in | Wt 162.0 lb

## 2020-10-19 DIAGNOSIS — N898 Other specified noninflammatory disorders of vagina: Secondary | ICD-10-CM

## 2020-10-19 DIAGNOSIS — Z01419 Encounter for gynecological examination (general) (routine) without abnormal findings: Secondary | ICD-10-CM | POA: Insufficient documentation

## 2020-10-19 DIAGNOSIS — Z01411 Encounter for gynecological examination (general) (routine) with abnormal findings: Secondary | ICD-10-CM

## 2020-10-19 DIAGNOSIS — Z124 Encounter for screening for malignant neoplasm of cervix: Secondary | ICD-10-CM

## 2020-10-19 LAB — POCT WET + KOH PREP
Trich by wet prep: ABSENT
Yeast by KOH: ABSENT
Yeast by wet prep: ABSENT

## 2020-10-19 NOTE — Patient Instructions (Addendum)
   If you have lab work done today you will be contacted with your lab results within the next 2 weeks.  If you have not heard from us then please contact us. The fastest way to get your results is to register for My Chart.   IF you received an x-ray today, you will receive an invoice from [facility]. Please contact [facility] at [phone] with questions or concerns regarding your invoice.   IF you received labwork today, you will receive an invoice from [facility]. Please contact [facility] at [phone] with questions or concerns regarding your invoice.   Our billing staff will not be able to assist you with questions regarding bills from these companies.  You will be contacted with the lab results as soon as they are available. The fastest way to get your results is to activate your My Chart account. Instructions are located on the last page of this paperwork. If you have not heard from us regarding the results in 2 weeks, please contact this office.      Pap Test Why am I having this test? A Pap test, also called a Pap smear, is a screening test to check for signs of:  Cancer of the vagina, cervix, and uterus. The cervix is the lower part of the uterus that opens into the vagina.  Infection.  Changes that may be a sign that cancer is developing (precancerous changes). Women need this test on a regular basis. In general, you should have a Pap test every 3 years until you reach menopause or age 65. Women aged 30-60 may choose to have their Pap test done at the same time as an HPV (human papillomavirus) test every 5 years (instead of every 3 years). Your health care provider may recommend having Pap tests more or less often depending on your medical conditions and past Pap test results. What kind of sample is taken?  Your health care provider will collect a sample of cells from the surface of your cervix. This will be done using a small cotton swab, plastic  spatula, or brush. This sample is often collected during a pelvic exam, when you are lying on your back on an exam table with feet in footrests (stirrups). In some cases, fluids (secretions) from the cervix or vagina may also be collected. How do I prepare for this test?  Be aware of where you are in your menstrual cycle. If you are menstruating on the day of the test, you may be asked to reschedule.  You may need to reschedule if you have a known vaginal infection on the day of the test.  Follow instructions from your health care provider about: ? Changing or stopping your regular medicines. Some medicines can cause abnormal test results, such as digitalis and tetracycline. ? Avoiding douching or taking a bath the day before or the day of the test. Tell a health care provider about:  Any allergies you have.  All medicines you are taking, including vitamins, herbs, eye drops, creams, and over-the-counter medicines.  Any blood disorders you have.  Any surgeries you have had.  Any medical conditions you have.  Whether you are pregnant or may be pregnant. How are the results reported? Your test results will be reported as either abnormal or normal. A false-positive result can occur. A false positive is incorrect because it means that a condition is present when it is not. A false-negative result can occur. A false negative is incorrect because it means   that a condition is not present when it is. What do the results mean? A normal test result means that you do not have signs of cancer of the vagina, cervix, or uterus. An abnormal result may mean that you have:  Cancer. A Pap test by itself is not enough to diagnose cancer. You will have more tests done in this case.  Precancerous changes in your vagina, cervix, or uterus.  Inflammation of the cervix.  An STD (sexually transmitted disease).  A fungal infection.  A parasite infection. Talk with your health care provider about  what your results mean. Questions to ask your health care provider Ask your health care provider, or the department that is doing the test:  When will my results be ready?  How will I get my results?  What are my treatment options?  What other tests do I need?  What are my next steps? Summary  In general, women should have a Pap test every 3 years until they reach menopause or age 65.  Your health care provider will collect a sample of cells from the surface of your cervix. This will be done using a small cotton swab, plastic spatula, or brush.  In some cases, fluids (secretions) from the cervix or vagina may also be collected. This information is not intended to replace advice given to you by your health care provider. Make sure you discuss any questions you have with your health care provider. Document Revised: 08/06/2017 Document Reviewed: 08/06/2017 Elsevier Patient Education  2020 Elsevier Inc.  

## 2020-10-19 NOTE — Progress Notes (Signed)
11/9/20213:14 PM  Peggy Barker 02/24/1981, 39 y.o., female [id number]  Chief Complaint  Patient presents with  . Gynecologic Exam    last pap 2 yrs ago / after periods notices stomach swelling and discharge/     HPI:   Patient is a 39 y.o. female with no significant past medical history who presents today for cervical cancer screening.  After period she notices abdominal pain and clear discharge Is not due for pap, requested by patient LMP: 10/20 Regular Periods Denies heavy bleeding and cramping Does not use birth control of any kind Denies need for STI testing No smell to discharge Denies itching, rash urinary symptoms.  Depression screen [facility] 2/9 10/19/2020 09/01/2020 07/23/2020  Decreased Interest 0 0 0  Down, Depressed, Hopeless 0 0 0  PHQ - 2 Score 0 0 0    Fall Risk  10/19/2020 09/01/2020 07/23/2020 01/29/2020 12/26/2019  Falls in the past year? 0 0 0 0 0  Number falls in past yr: 0 0 0 0 0  Injury with Fall? 0 0 0 0 0  Follow up Falls evaluation completed Falls evaluation completed Falls evaluation completed - -     No Known Allergies  Prior to Admission medications   Medication Sig Start Date End Date Taking? Authorizing Provider  methocarbamol (ROBAXIN) 500 MG tablet Take 1 tablet (500 mg total) by mouth every 8 (eight) hours as needed for muscle spasms. Patient not taking: Reported on 05/25/2020 04/26/20   Myles Lipps, MD  tiZANidine (ZANAFLEX) 4 MG tablet Take 1 tablet (4 mg total) by mouth every 8 (eight) hours as needed for muscle spasms. Patient not taking: Reported on 09/01/2020 07/23/20   Myles Lipps, MD  Turmeric (QC TUMERIC COMPLEX PO) Take by mouth. Patient not taking: Reported on 09/01/2020    [provider]    Past Medical History:  Diagnosis Date  . GERD (gastroesophageal reflux disease)     History reviewed. No pertinent surgical history.  Social History   Tobacco Use  . Smoking status: Never Smoker  . Smokeless  tobacco: Never Used  Substance Use Topics  . Alcohol use: No    Alcohol/week: 0.0 standard drinks    Family History  Problem Relation Age of Onset  . Healthy Mother   . Healthy Sister     Review of Systems  Constitutional: Negative for chills, fever and malaise/fatigue.  Eyes: Negative for blurred vision and double vision.  Respiratory: Negative for cough, shortness of breath and wheezing.   Cardiovascular: Negative for chest pain, palpitations and leg swelling.  Gastrointestinal: Negative for abdominal pain, blood in stool, constipation, diarrhea, heartburn, nausea and vomiting.  Genitourinary: Negative for dysuria, frequency, hematuria and urgency.       Increased vaginal discharge  Musculoskeletal: Negative for back pain and joint pain.  Skin: Negative for rash.  Neurological: Negative for dizziness, weakness and headaches.   OBJECTIVE:  Today's Vitals   10/19/20 1436  BP: 113/78  Pulse: 95  Temp: 98.4 F (36.9 C)  SpO2: 98%  Weight: 162 lb (73.5 kg)  Height: 5\' 5"  (1.651 m)   Body mass index is 26.96 kg/m.   Physical Exam Constitutional:      General: She is not in acute distress.    Appearance: Normal appearance. She is not ill-appearing.  HENT:     Head: Normocephalic.  Cardiovascular:     Rate and Rhythm: Normal rate and regular rhythm.     Pulses: Normal pulses.  Heart sounds: Normal heart sounds. No murmur heard.  No friction rub. No gallop.   Pulmonary:     Effort: Pulmonary effort is normal. No respiratory distress.     Breath sounds: Normal breath sounds. No stridor. No wheezing, rhonchi or rales.  Abdominal:     General: Bowel sounds are normal.     Palpations: Abdomen is soft.     Tenderness: There is no abdominal tenderness.  Musculoskeletal:     Right lower leg: No edema.     Left lower leg: No edema.  Skin:    General: Skin is warm and dry.  Neurological:     Mental Status: She is alert and oriented to person, place, and time.    Psychiatric:        Mood and Affect: Mood normal.        Behavior: Behavior normal.   Pelvic exam: normal external genitalia, vulva, vagina, cervix, uterus and adnexa, VULVA: normal appearing vulva with no masses, tenderness or lesions, VAGINA: normal appearing vagina with normal color and white discharge, no lesions, CERVIX: normal appearing cervix without discharge or lesions, UTERUS: uterus is normal size, shape, consistency and nontender, ADNEXA: normal adnexa in size, nontender and no masses, no palpable internal organs, PAP: Pap smear done today, thin-prep method, DNA probe for chlamydia and GC obtained, HPV test, WET MOUNT done - results: KOH done, exam chaperoned by [facility] LPN.Marland Kitchen   Results for orders placed or performed in visit on 10/19/20 (from the past 24 hour(s))  POCT Wet + KOH Prep     Status: Abnormal   Collection Time: 10/19/20  3:11 PM  Result Value Ref Range   Yeast by KOH Absent Absent   Yeast by wet prep Absent Absent   WBC by wet prep None (A) Few   Clue Cells Wet Prep HPF POC None None   Trich by wet prep Absent Absent   Bacteria Wet Prep HPF POC Few Few   Epithelial Cells By [employer] ([facility]) Few None, Few, Too numerous to count   RBC,UR,HPF,POC None None RBC/hpf    No results found.   ASSESSMENT and PLAN  Problem List Items Addressed This Visit    None    Visit Diagnoses    Cervical cancer screening    -  Primary   Relevant Orders   Cytology - PAP([place])   POCT Wet + KOH Prep (Completed)   Vaginal discharge       Relevant Orders   Cytology - PAP([place])     Will follow up with lab results.  Return for annual visit.    Macario Carls Braylin Xu, FNP-BC [facility] at [facility] [address] [place], [place] [address] Ph.  [phone] Fax [phone]

## 2020-10-22 LAB — CYTOLOGY - PAP
Chlamydia: NEGATIVE
Comment: NEGATIVE
Comment: NEGATIVE
Comment: NEGATIVE
Comment: NORMAL
Diagnosis: NEGATIVE
High risk HPV: NEGATIVE
Neisseria Gonorrhea: NEGATIVE
Trichomonas: NEGATIVE

## 2020-10-25 ENCOUNTER — Telehealth: Payer: Self-pay

## 2020-10-25 NOTE — Progress Notes (Signed)
If you could let Peggy Barker know her pap came back negative for abnormalities and she is HPV negative. Thus she will not need another for 5 years. Thanks!

## 2020-10-25 NOTE — Telephone Encounter (Signed)
Trid reaching out to patient to inform her of her most recent test results . LVM

## 2020-12-17 ENCOUNTER — Other Ambulatory Visit: Payer: Self-pay

## 2020-12-17 ENCOUNTER — Ambulatory Visit (INDEPENDENT_AMBULATORY_CARE_PROVIDER_SITE_OTHER): Payer: 59

## 2020-12-17 ENCOUNTER — Encounter: Payer: Self-pay | Admitting: Family Medicine

## 2020-12-17 ENCOUNTER — Ambulatory Visit: Payer: 59 | Admitting: Family Medicine

## 2020-12-17 VITALS — BP 106/73 | HR 99 | Temp 97.9°F | Resp 17 | Ht 65.0 in | Wt 162.0 lb

## 2020-12-17 DIAGNOSIS — M25561 Pain in right knee: Secondary | ICD-10-CM

## 2020-12-17 DIAGNOSIS — M7631 Iliotibial band syndrome, right leg: Secondary | ICD-10-CM | POA: Diagnosis not present

## 2020-12-17 DIAGNOSIS — N926 Irregular menstruation, unspecified: Secondary | ICD-10-CM | POA: Diagnosis not present

## 2020-12-17 MED ORDER — DICLOFENAC SODIUM 75 MG PO TBEC: 75.0000 mg | DELAYED_RELEASE_TABLET | Freq: Two times a day (BID) | ORAL | 1 refills | Status: DC

## 2020-12-17 NOTE — Progress Notes (Signed)
Patient ID: Peggy Barker, female    DOB: 08/08/1981  Age: 40 y.o. MRN: [id number]  Chief Complaint  Patient presents with  . Knee Pain    Pt has been having Rt knee pain for 1 month no known injury, no popping has tried ice heat and ibuprofen not better has stayed about the same     Subjective:   For over a month the patient has been hurting in the lateral aspect of her right knee.  Sometime ago in the past she has seen a doctor somewhere and was told that she had some right-sided sciatica.  This has been a little bit different.  It hurts her if she crosses her legs.  It hurts her seated and getting up.  Sometimes when she is walking around it is painful.  She does not do a lot of regular exercise.  She has been using some heat on it.  Current allergies, medications, problem list, past/family and social histories reviewed.  Objective:  BP 106/73   Pulse 99   Temp 97.9 F (36.6 C) (Temporal)   Resp 17   Ht 5\' 5"  (1.651 m)   Wt 162 lb (73.5 kg)   LMP 12/13/2020   SpO2 98%   BMI 26.96 kg/m   Straight leg raising test negative except for tight hamstrings.  He had she has good range of motion of her knee.  No crepitance.  No effusion.  Mild tenderness in the lateral aspect of the right popliteal fossa.  Most of the tenderness is just lateral to the knee and just below the knee area on the lateral side.  X-ray negative.  Assessment & Plan:   Assessment: 1. Iliotibial band syndrome of right side   2. Right knee pain, unspecified chronicity   3. Abnormal menses       Plan: See instructions  Orders Placed This Encounter  Procedures  . DG Knee Complete 4 Views Right    Order Specific Question:   Reason for Exam (SYMPTOM  OR DIAGNOSIS REQUIRED)    Answer:   knee pain    Order Specific Question:   Is patient pregnant?    Answer:   No    Order Specific Question:   Preferred imaging location?    Answer:   Internal    Order Specific Question:   Release to patient     Answer:   Immediate    Meds ordered this encounter  Medications  . diclofenac (VOLTAREN) 75 MG EC tablet    Sig: Take 1 tablet (75 mg total) by mouth 2 (two) times daily.    Dispense:  40 tablet    Refill:  1         Patient Instructions    Your problem seems to be caused by the iliotibial band syndrome which is an inflammation of the ligaments on the side of the knee and leg.  Take diclofenac 75 mg 1 twice daily at breakfast and supper for the next several weeks.   Continue to use ice and heat alternating on the area of pain and inflammation.  If you are not doing better we can refer you to a sports medicine specialist.  You also might need some physical therapy.  Return as needed  You can view ileotibial band syndrome online at 02/10/2021 Or google other sites.   If you have lab work done today you will be contacted with your lab results within the next 2 weeks.  If you have not heard from  [place] then please contact us. The fastest way to get your results is to register for My Chart.   IF you received an x-ray today, you will receive an invoice from [facility]. Please contact [facility] at [phone] with questions or concerns regarding your invoice.   IF you received labwork today, you will receive an invoice from [facility]. Please contact [facility] at [phone] with questions or concerns regarding your invoice.   Our billing staff will not be able to assist you with questions regarding bills from these companies.  You will be contacted with the lab results as soon as they are available. The fastest way to get your results is to activate your My Chart account. Instructions are located on the last page of this paperwork. If you have not heard from [place] regarding the results in 2 weeks, please contact this office.        Return if symptoms worsen or fail to improve.   Janace Hoard, MD 12/17/2020

## 2020-12-17 NOTE — Patient Instructions (Addendum)
°  Your problem seems to be caused by the iliotibial band syndrome which is an inflammation of the ligaments on the side of the knee and leg.  Take diclofenac 75 mg 1 twice daily at breakfast and supper for the next several weeks.   Continue to use ice and heat alternating on the area of pain and inflammation.  If you are not doing better we can refer you to a sports medicine specialist.  You also might need some physical therapy.  Return as needed  You can view ileotibial band syndrome online at https://www.wood.com/ Or google other sites.   If you have lab work done today you will be contacted with your lab results within the next 2 weeks.  If you have not heard from [place] then please contact us. The fastest way to get your results is to register for My Chart.   IF you received an x-ray today, you will receive an invoice from [facility]. Please contact [facility] at [phone] with questions or concerns regarding your invoice.   IF you received labwork today, you will receive an invoice from [facility]. Please contact [facility] at [phone] with questions or concerns regarding your invoice.   Our billing staff will not be able to assist you with questions regarding bills from these companies.  You will be contacted with the lab results as soon as they are available. The fastest way to get your results is to activate your My Chart account. Instructions are located on the last page of this paperwork. If you have not heard from [place] regarding the results in 2 weeks, please contact this office.

## 2021-01-25 IMAGING — DX DG KNEE COMPLETE 4+V*R*
4 series · 4 of 4 positions shown · non-contrast
Comparison: None.

CLINICAL DATA: Right knee pain

EXAM:
RIGHT KNEE - COMPLETE 4+ VIEW

[knee ap]
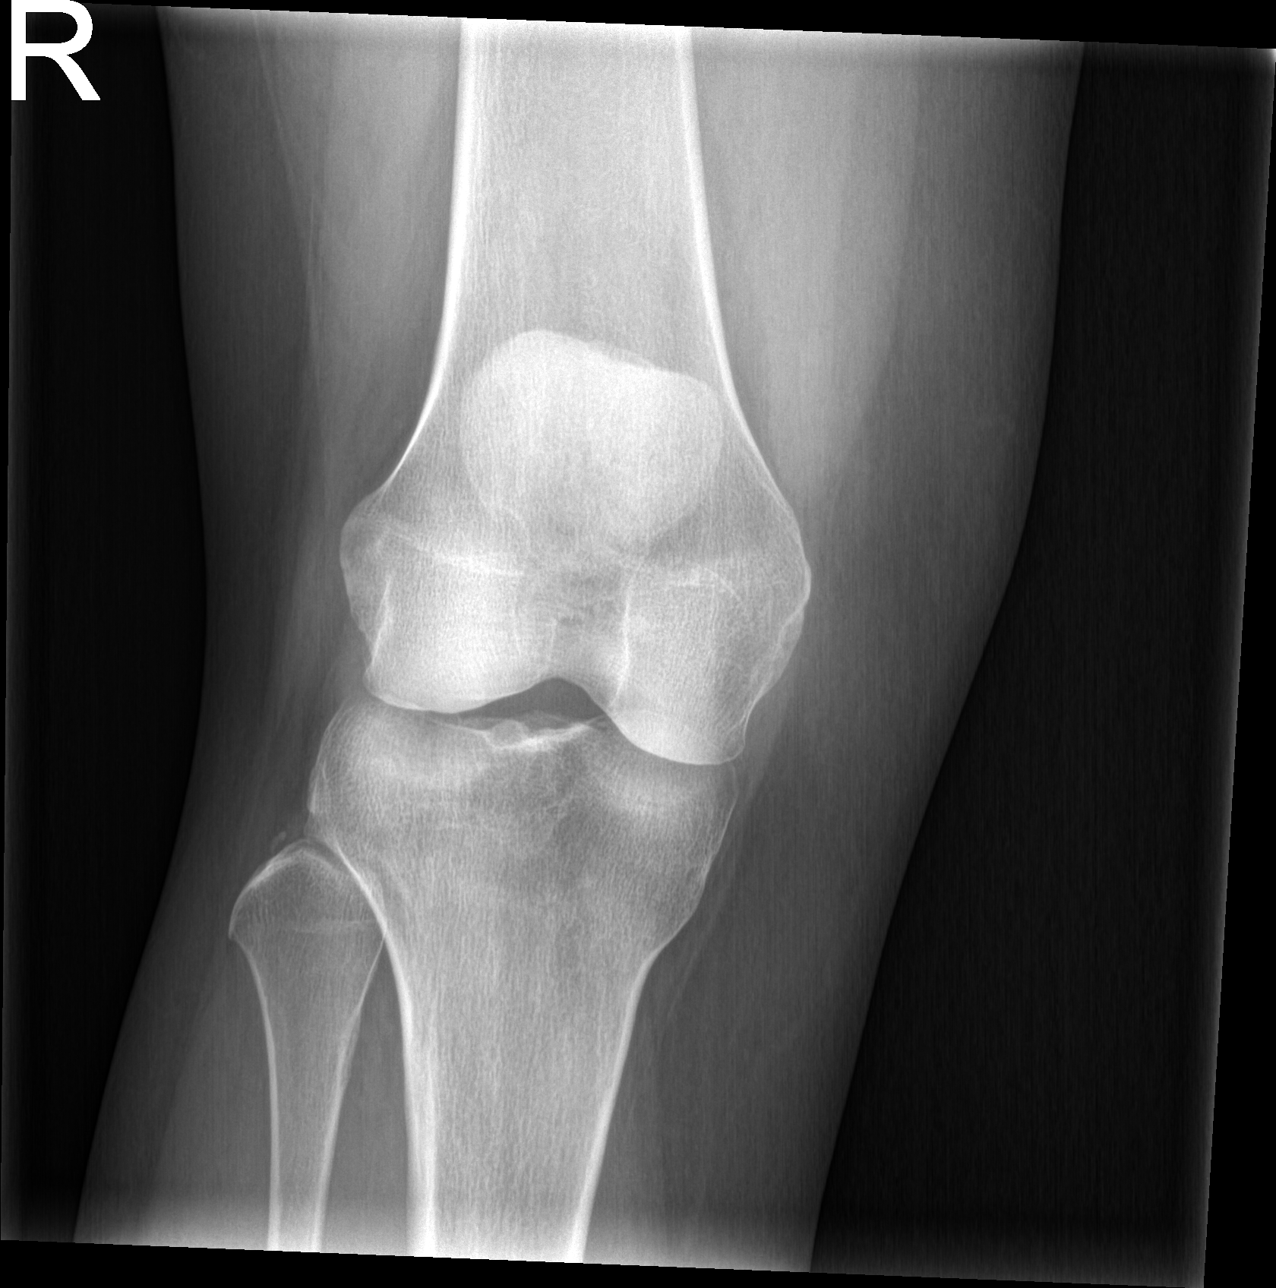

[knee lat]
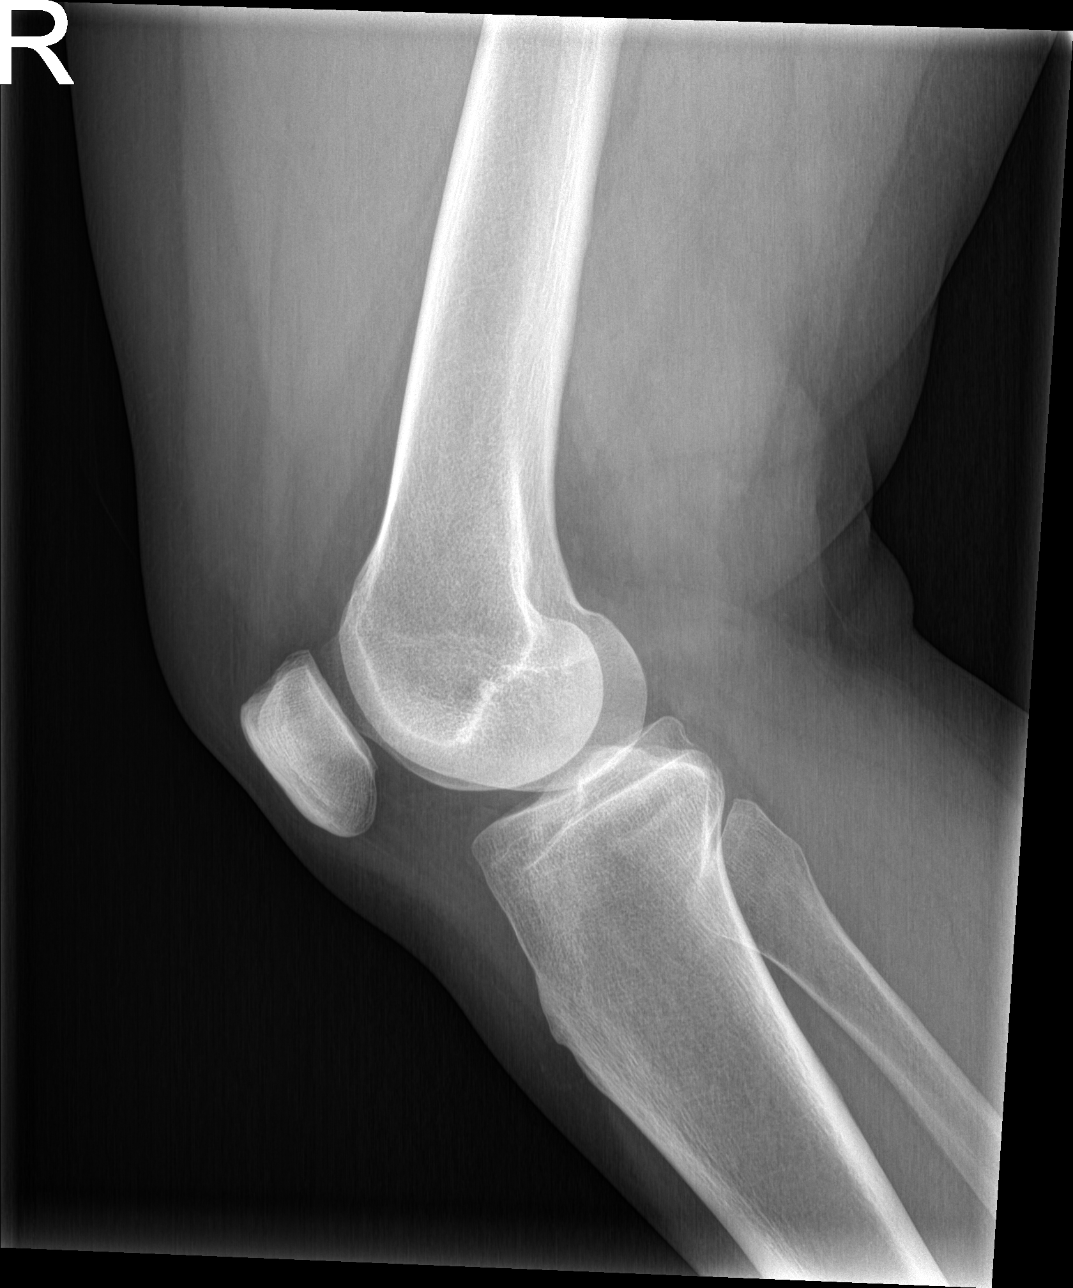

[sunrise]
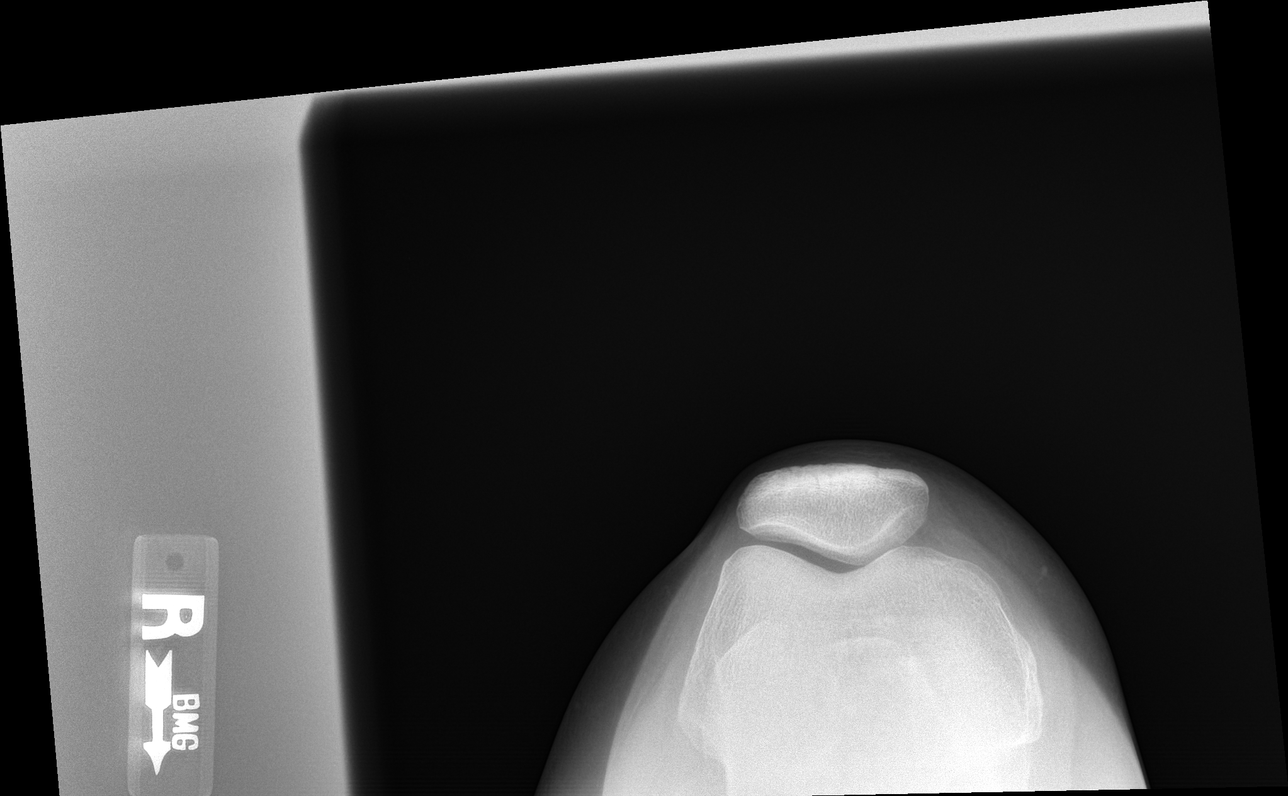

[knee [person_name]]
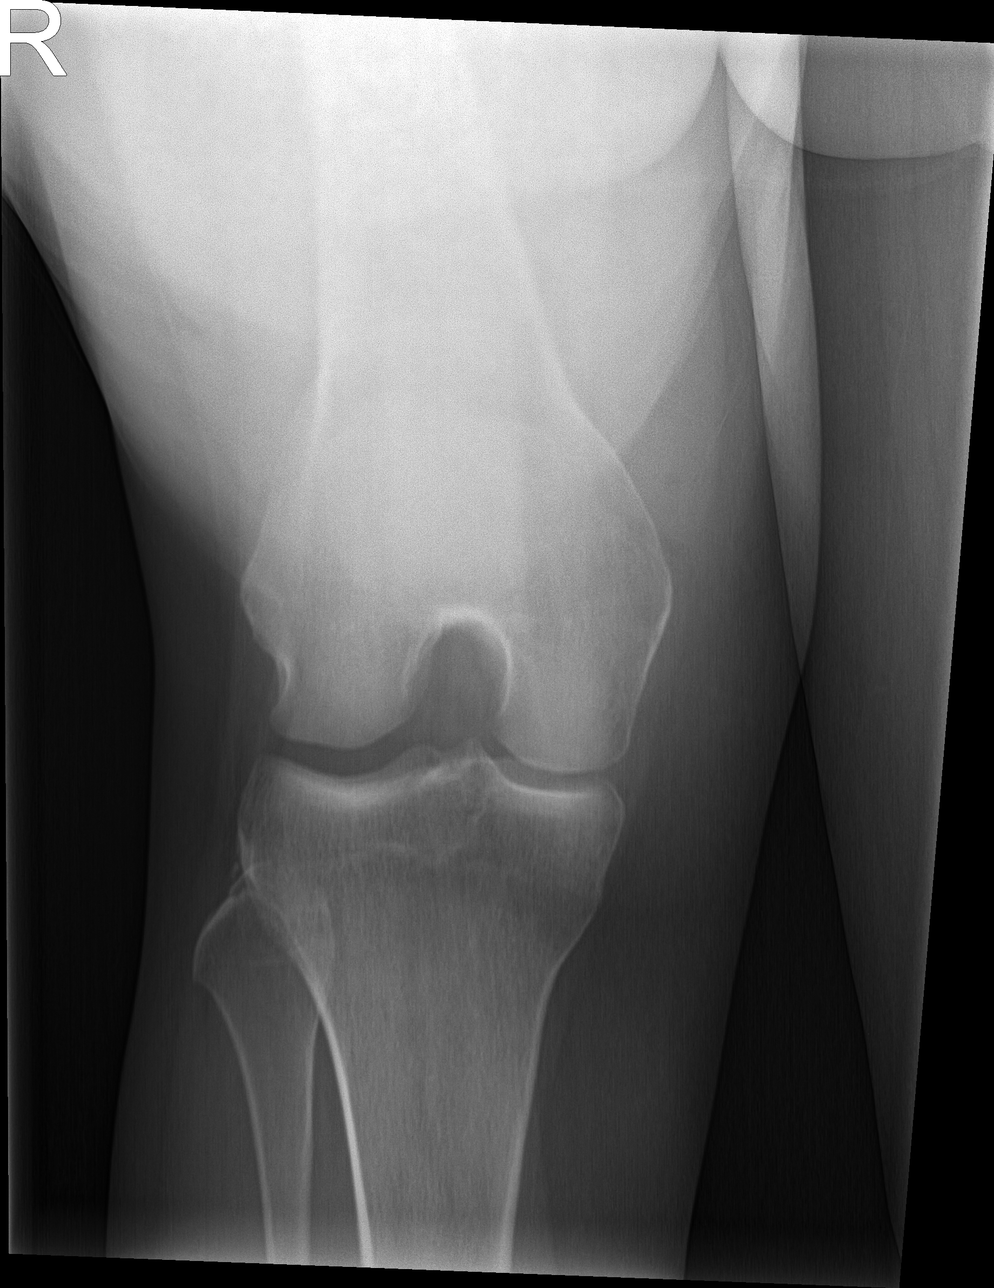

[4 of 4 positions shown; findings below may reference images not displayed]

FINDINGS: No fracture or dislocation is seen.

The joint spaces are preserved.

Visualized soft tissues are within normal limits.

No suprapatellar knee joint effusion.
IMPRESSION: Negative.

## 2024-03-03 ENCOUNTER — Encounter: Payer: Self-pay | Admitting: Internal Medicine

## 2024-03-03 ENCOUNTER — Ambulatory Visit: Payer: 59 | Admitting: Internal Medicine

## 2024-03-03 VITALS — BP 110/80 | HR 88 | Temp 97.9°F | Ht 64.0 in | Wt 176.0 lb

## 2024-03-03 DIAGNOSIS — E781 Pure hyperglyceridemia: Secondary | ICD-10-CM | POA: Diagnosis not present

## 2024-03-03 DIAGNOSIS — M5441 Lumbago with sciatica, right side: Secondary | ICD-10-CM

## 2024-03-03 DIAGNOSIS — Z124 Encounter for screening for malignant neoplasm of cervix: Secondary | ICD-10-CM | POA: Diagnosis not present

## 2024-03-03 DIAGNOSIS — G8929 Other chronic pain: Secondary | ICD-10-CM | POA: Diagnosis not present

## 2024-03-03 LAB — COMPREHENSIVE METABOLIC PANEL
ALT: 51 U/L — ABNORMAL HIGH (ref 0–35)
AST: 32 U/L (ref 0–37)
Albumin: 4.5 g/dL (ref 3.5–5.2)
Alkaline Phosphatase: 59 U/L (ref 39–117)
BUN: 12 mg/dL (ref 6–23)
CO2: 27 meq/L (ref 19–32)
Calcium: 9.9 mg/dL (ref 8.4–10.5)
Chloride: 101 meq/L (ref 96–112)
Creatinine, Ser: 0.7 mg/dL (ref 0.40–1.20)
GFR: 108.06 mL/min (ref >60.00)
Glucose, Bld: 88 mg/dL (ref 70–99)
Potassium: 4.3 meq/L (ref 3.5–5.1)
Sodium: 136 meq/L (ref 135–145)
Total Bilirubin: 0.8 mg/dL (ref 0.2–1.2)
Total Protein: 7.6 g/dL (ref 6.0–8.3)

## 2024-03-03 LAB — CBC WITH DIFFERENTIAL/PLATELET
Basophils Absolute: 0 10*3/uL (ref 0.0–0.1)
Basophils Relative: 0.5 % (ref 0.0–3.0)
Eosinophils Absolute: 0.1 10*3/uL (ref 0.0–0.7)
Eosinophils Relative: 1.9 % (ref 0.0–5.0)
HCT: 43.4 % (ref 36.0–46.0)
Hemoglobin: 14.6 g/dL (ref 12.0–15.0)
Lymphocytes Relative: 45.1 % (ref 12.0–46.0)
Lymphs Abs: 2.3 10*3/uL (ref 0.7–4.0)
MCHC: 33.6 g/dL (ref 30.0–36.0)
MCV: 87.4 fl (ref 78.0–100.0)
Monocytes Absolute: 0.5 10*3/uL (ref 0.1–1.0)
Monocytes Relative: 8.9 % (ref 3.0–12.0)
Neutro Abs: 2.2 10*3/uL (ref 1.4–7.7)
Neutrophils Relative %: 43.6 % (ref 43.0–77.0)
Platelets: 274 10*3/uL (ref 150.0–400.0)
RBC: 4.97 Mil/uL (ref 3.87–5.11)
RDW: 12.9 % (ref 11.5–15.5)
WBC: 5.1 10*3/uL (ref 4.0–10.5)

## 2024-03-03 LAB — LIPID PANEL
Cholesterol: 208 mg/dL — ABNORMAL HIGH (ref 0–200)
HDL: 44.8 mg/dL (ref >39.00)
LDL Cholesterol: 116 mg/dL — ABNORMAL HIGH (ref 0–99)
NonHDL: 163.09
Total CHOL/HDL Ratio: 5
Triglycerides: 234 mg/dL — ABNORMAL HIGH (ref 0.0–149.0)
VLDL: 46.8 mg/dL — ABNORMAL HIGH (ref 0.0–40.0)

## 2024-03-03 LAB — HEMOGLOBIN A1C: Hgb A1c MFr Bld: 5.4 % (ref 4.6–6.5)

## 2024-03-03 MED ORDER — MELOXICAM 7.5 MG PO TABS: 7.5000 mg | ORAL_TABLET | Freq: Every day | ORAL | 0 refills | Status: AC

## 2024-03-03 NOTE — Progress Notes (Signed)
     New patient Office Visit     CC/Reason for Visit: Establish care, discuss chronic and acute concerns  HPI: Peggy Barker is a 43 y.o. female who is coming in today for the above mentioned reasons. Past Medical History is significant for: Obesity, hypertriglyceridemia.  She does not smoke or drink, no alcohol use, no significant past surgical history.  She has been having right lower back pain radiating down her buttock and the posterior lateral thigh.   Past Medical/Surgical History: Past Medical History:  Diagnosis Date   GERD (gastroesophageal reflux disease)    Hypertriglyceridemia     History reviewed. No pertinent surgical history.  Social History:  reports that she has never smoked. She has never used smokeless tobacco. She reports that she does not drink alcohol and does not use drugs.  Allergies: No Known Allergies  Family History:  Family History  Problem Relation Age of Onset   Healthy Mother    Healthy Sister     No current outpatient medications on file.  Review of Systems:  Negative unless indicated in HPI.   Physical Exam: Vitals:   03/03/24 1255  BP: 110/80  Pulse: 88  Temp: 97.9 F (36.6 C)  TempSrc: Oral  SpO2: 98%  Weight: 176 lb (79.8 kg)  Height: 5\' 4"  (1.626 m)    Body mass index is 30.21 kg/m.   Physical Exam Vitals reviewed.  Constitutional:      Appearance: Normal appearance.  HENT:     Head: Normocephalic and atraumatic.  Eyes:     Conjunctiva/sclera: Conjunctivae normal.  Cardiovascular:     Rate and Rhythm: Normal rate and regular rhythm.  Pulmonary:     Effort: Pulmonary effort is normal.     Breath sounds: Normal breath sounds.  Skin:    General: Skin is warm and dry.  Neurological:     General: No focal deficit present.     Mental Status: She is alert and oriented to person, place, and time.  Psychiatric:        Mood and Affect: Mood normal.        Behavior: Behavior normal.        Thought  Content: Thought content normal.        Judgment: Judgment normal.      Impression and Plan:  Hypertriglyceridemia -     Hemoglobin A1c; Future -     Lipid panel; Future -     Comprehensive metabolic panel; Future -     CBC with Differential/Platelet; Future  Screening for cervical cancer -     Ambulatory referral to Gynecology  Chronic right-sided low back pain with right-sided sciatica  -For lower back pain we will give meloxicam, back stretches.  If not improved will consider physical therapy next. -Check basic labs today. -Refer to GYN.   Time spent:46 minutes reviewing chart, interviewing and examining patient and formulating plan of care.     Chaya Jan, MD [facility] at [facility]

## 2024-03-04 ENCOUNTER — Other Ambulatory Visit: Payer: Self-pay | Admitting: *Deleted

## 2024-03-04 DIAGNOSIS — E781 Pure hyperglyceridemia: Secondary | ICD-10-CM

## 2024-04-30 ENCOUNTER — Encounter: Payer: Self-pay | Admitting: Internal Medicine

## 2024-04-30 ENCOUNTER — Ambulatory Visit: Admitting: Internal Medicine

## 2024-04-30 VITALS — BP 124/70 | HR 90 | Temp 98.2°F | Ht 64.0 in | Wt 177.1 lb

## 2024-04-30 DIAGNOSIS — M549 Dorsalgia, unspecified: Secondary | ICD-10-CM

## 2024-04-30 DIAGNOSIS — N6314 Unspecified lump in the right breast, lower inner quadrant: Secondary | ICD-10-CM

## 2024-04-30 NOTE — Progress Notes (Signed)
     Established Patient Office Visit     CC/Reason for Visit: Continued right back pain and evaluation of her right breast lump  HPI: Peggy Barker is a 43 y.o. female who is coming in today for the above mentioned reasons.  She was seen previously for her right upper thoracic back pain and was asked to do back stretches, ice, was given meloxicam .  She has done all this and has not yet had significant relief.  She is requesting physical therapy.  She would also like me to evaluate her right breast lump.  She first noted this 1 week ago.  It is not painful to palpation.  No nipple pain or discharge.   Past Medical/Surgical History: Past Medical History:  Diagnosis Date   GERD (gastroesophageal reflux disease)    Hypertriglyceridemia     History reviewed. No pertinent surgical history.  Social History:  reports that she has never smoked. She has never used smokeless tobacco. She reports that she does not drink alcohol and does not use drugs.  Allergies: No Known Allergies  Family History:  Family History  Problem Relation Age of Onset   Healthy Mother    Healthy Sister      Current Outpatient Medications:    meloxicam  (MOBIC ) 7.5 MG tablet, Take 1 tablet (7.5 mg total) by mouth daily., Disp: 30 tablet, Rfl: 0  Review of Systems:  Negative unless indicated in HPI.   Physical Exam: Vitals:   04/30/24 0701  BP: 124/70  Pulse: 90  Temp: 98.2 F (36.8 C)  TempSrc: Oral  SpO2: 99%  Weight: 177 lb 1.6 oz (80.3 kg)  Height: 5\' 4"  (1.626 m)    Body mass index is 30.4 kg/m.   Physical Exam Chest:       Comments: About a 2 cm oval mass of the right breast at the 5 o'clock position.     Impression and Plan:  Upper back pain -     Ambulatory referral to Physical Therapy  Breast lump on right side at 5 o'clock position -     Digital Screening Mammogram, Left and Right; Future -     Digital Screening Mammogram, Right; Future -     US  BREAST  COMPLETE UNI RIGHT INC AXILLA; Future   - Will send to physical therapy for her continued back pain, she will continue meloxicam . - Will do screening and diagnostic imaging of her right breast.  Time spent:30 minutes reviewing chart, interviewing and examining patient and formulating plan of care.     Marguerita Shih, MD [facility] at [facility]

## 2024-05-19 ENCOUNTER — Ambulatory Visit
Admission: RE | Admit: 2024-05-19 | Discharge: 2024-05-19 | Disposition: A | Discharge status: Home / Self Care | Source: Ambulatory Visit | Attending: Internal Medicine | Admitting: Internal Medicine

## 2024-05-19 ENCOUNTER — Other Ambulatory Visit: Payer: Self-pay | Admitting: Internal Medicine

## 2024-05-19 DIAGNOSIS — N6310 Unspecified lump in the right breast, unspecified quadrant: Secondary | ICD-10-CM

## 2024-05-19 DIAGNOSIS — N6001 Solitary cyst of right breast: Secondary | ICD-10-CM

## 2024-05-19 DIAGNOSIS — N6314 Unspecified lump in the right breast, lower inner quadrant: Secondary | ICD-10-CM

## 2024-05-19 DIAGNOSIS — N6320 Unspecified lump in the left breast, unspecified quadrant: Secondary | ICD-10-CM

## 2024-05-23 ENCOUNTER — Telehealth: Payer: Self-pay

## 2024-05-23 NOTE — Telephone Encounter (Signed)
 Copied from CRM [id number]. Topic: General - Other >> May 23, 2024  9:00 AM Howard Macho wrote: Reason for CRM: morgan from [facility] called stating they need a signed order for a biopsy and aspiration  CB [phone] ext [phone]

## 2024-05-26 ENCOUNTER — Ambulatory Visit
Admission: RE | Admit: 2024-05-26 | Discharge: 2024-05-26 | Disposition: A | Discharge status: Home / Self Care | Source: Ambulatory Visit | Attending: Internal Medicine | Admitting: Internal Medicine

## 2024-05-26 DIAGNOSIS — N6001 Solitary cyst of right breast: Secondary | ICD-10-CM

## 2024-05-26 DIAGNOSIS — N6320 Unspecified lump in the left breast, unspecified quadrant: Secondary | ICD-10-CM

## 2024-05-26 HISTORY — PX: BREAST BIOPSY: SHX20

## 2024-05-26 NOTE — Telephone Encounter (Signed)
Looks like this has been completed.

## 2024-05-27 LAB — SURGICAL PATHOLOGY
